# Patient Record
Sex: Female | Born: 1995
Health system: Southern US, Community
[De-identification: ages and names within clinical notes are randomized; demographics above are authoritative.]

## PROBLEM LIST (undated history)

## (undated) ENCOUNTER — Inpatient Hospital Stay (HOSPITAL_COMMUNITY): Payer: Self-pay

## (undated) DIAGNOSIS — F329 Major depressive disorder, single episode, unspecified: Secondary | ICD-10-CM

## (undated) DIAGNOSIS — Z5189 Encounter for other specified aftercare: Secondary | ICD-10-CM

## (undated) DIAGNOSIS — F1910 Other psychoactive substance abuse, uncomplicated: Secondary | ICD-10-CM

## (undated) DIAGNOSIS — IMO0002 Reserved for concepts with insufficient information to code with codable children: Secondary | ICD-10-CM

## (undated) DIAGNOSIS — F4310 Post-traumatic stress disorder, unspecified: Secondary | ICD-10-CM

## (undated) DIAGNOSIS — F603 Borderline personality disorder: Secondary | ICD-10-CM

## (undated) DIAGNOSIS — J189 Pneumonia, unspecified organism: Secondary | ICD-10-CM

## (undated) DIAGNOSIS — Z7289 Other problems related to lifestyle: Secondary | ICD-10-CM

## (undated) DIAGNOSIS — F909 Attention-deficit hyperactivity disorder, unspecified type: Secondary | ICD-10-CM

## (undated) DIAGNOSIS — O2340 Unspecified infection of urinary tract in pregnancy, unspecified trimester: Secondary | ICD-10-CM

## (undated) DIAGNOSIS — F32A Depression, unspecified: Secondary | ICD-10-CM

## (undated) DIAGNOSIS — D649 Anemia, unspecified: Secondary | ICD-10-CM

## (undated) DIAGNOSIS — F419 Anxiety disorder, unspecified: Secondary | ICD-10-CM

## (undated) DIAGNOSIS — Z3490 Encounter for supervision of normal pregnancy, unspecified, unspecified trimester: Secondary | ICD-10-CM

## (undated) DIAGNOSIS — F319 Bipolar disorder, unspecified: Secondary | ICD-10-CM

## (undated) DIAGNOSIS — T1491XA Suicide attempt, initial encounter: Secondary | ICD-10-CM

## (undated) HISTORY — DX: Unspecified infection of urinary tract in pregnancy, unspecified trimester: O23.40

## (undated) HISTORY — DX: Borderline personality disorder: F60.3

## (undated) HISTORY — DX: Bipolar disorder, unspecified: F31.9

## (undated) HISTORY — DX: Encounter for other specified aftercare: Z51.89

## (undated) HISTORY — DX: Encounter for supervision of normal pregnancy, unspecified, unspecified trimester: Z34.90

## (undated) HISTORY — PX: OTHER SURGICAL HISTORY: SHX169

## (undated) HISTORY — DX: Other problems related to lifestyle: Z72.89

## (undated) HISTORY — DX: Other psychoactive substance abuse, uncomplicated: F19.10

## (undated) HISTORY — DX: Anemia, unspecified: D64.9

## (undated) HISTORY — DX: Anxiety disorder, unspecified: F41.9

## (undated) HISTORY — DX: Reserved for concepts with insufficient information to code with codable children: IMO0002

## (undated) HISTORY — DX: Suicide attempt, initial encounter: T14.91XA

---
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
## 2017-01-12 ENCOUNTER — Ambulatory Visit (INDEPENDENT_AMBULATORY_CARE_PROVIDER_SITE_OTHER): Payer: Medicaid Other | Admitting: Obstetrics and Gynecology

## 2017-01-12 ENCOUNTER — Encounter: Payer: Self-pay | Admitting: Obstetrics and Gynecology

## 2017-01-12 ENCOUNTER — Ambulatory Visit (INDEPENDENT_AMBULATORY_CARE_PROVIDER_SITE_OTHER): Payer: Medicaid Other

## 2017-01-12 VITALS — BP 100/60 | HR 76 | Wt 151.8 lb

## 2017-01-12 DIAGNOSIS — O2302 Infections of kidney in pregnancy, second trimester: Secondary | ICD-10-CM | POA: Diagnosis not present

## 2017-01-12 DIAGNOSIS — Z1389 Encounter for screening for other disorder: Secondary | ICD-10-CM | POA: Diagnosis not present

## 2017-01-12 DIAGNOSIS — Z3403 Encounter for supervision of normal first pregnancy, third trimester: Secondary | ICD-10-CM

## 2017-01-12 DIAGNOSIS — Z331 Pregnant state, incidental: Secondary | ICD-10-CM | POA: Diagnosis not present

## 2017-01-12 DIAGNOSIS — F332 Major depressive disorder, recurrent severe without psychotic features: Secondary | ICD-10-CM | POA: Diagnosis not present

## 2017-01-12 LAB — POCT URINALYSIS DIPSTICK
Blood, UA: NEGATIVE
Glucose, UA: NEGATIVE
KETONES UA: NEGATIVE
LEUKOCYTES UA: NEGATIVE
NITRITE UA: NEGATIVE
PROTEIN UA: NEGATIVE

## 2017-01-12 NOTE — Progress Notes (Signed)
Patient ID: Tracy Stout, female   DOB: 31-Aug-1996, 21 y.o.   MRN: [id number] Fetal Surveillance Testing today:  [name]   High Risk Pregnancy Diagnosis(es):   Hx pneumonia/ARDS this pregnancy, Depression with self mutilation, Multiple illicit drug abuse  G1P0 [redacted]w[redacted]d Estimated Date of Delivery: 02/21/17  Blood pressure 100/60, pulse 76, weight 151 lb 12.8 oz (68.9 kg), last menstrual period 04/27/2016.  Urinalysis: Negative   HPI: The patient is being seen today for ongoing management of  Hx pneumonia/ARDS this pregnancy, Depression with self mutilation, Multiple illicit drug abuse. Today she reports tops of bilateral feet and toes with swelling noted. She states that she has been evaluated in the hospital recently due to near-syncope episode. Pt reports that she would like the IUD following delivery. Denies any other symptoms.    BP weight and urine results all reviewed and noted. Patient reports good fetal movement, denies any bleeding and no rupture of membranes symptoms or regular contractions.  Fundal Height:  35 cm Fetal Heart rate:  157 per [name] Edema:   Patient is without complaints other than noted in her HPI. All questions were answered.  All lab and sonogram results have been reviewed. Comments: normal  Assessment:  1.  Pregnancy at [redacted]w[redacted]d,  Estimated Date of Delivery: 02/21/17 :                         2.   Hx pneumonia/ARDS this pregnancy   3. Depression with self mutilation   4. Multiple illicit drug abuse recheck UDS  Medication(s) Plans:   Treatment Plan: follow up in 2 weeks for HROB  No Follow-up on file. for appointment for high risk OB care  No orders of the defined types were placed in this encounter.  Orders Placed This Encounter  Procedures  . POCT urinalysis dipstick   By signing my name below, I, Soijett Blue, attest that this documentation has been prepared under the direction and in the presence of Tilda Burrow, MD. Electronically Signed: Soijett Blue, ED  Scribe. 01/12/17. 10:01 AM.  I personally performed the services described in this documentation, which was SCRIBED in my presence. The recorded information has been reviewed and considered accurate. It has been edited as necessary during review. Tilda Burrow, MD

## 2017-01-12 NOTE — Progress Notes (Signed)
[name] 34+2 wks,cephalic,ant pl gr 1,normal ovaries bilat,fhr 157 bpm,afi 15.5 cm,efw 2627 g 62%

## 2017-01-22 ENCOUNTER — Inpatient Hospital Stay (HOSPITAL_COMMUNITY)
Admission: AD | Admit: 2017-01-22 | Discharge: 2017-01-22 | Disposition: A | Discharge status: Home / Self Care | Payer: Medicaid Other | Source: Ambulatory Visit | Attending: Obstetrics & Gynecology | Admitting: Obstetrics & Gynecology

## 2017-01-22 ENCOUNTER — Encounter (HOSPITAL_COMMUNITY): Payer: Self-pay

## 2017-01-22 DIAGNOSIS — M549 Dorsalgia, unspecified: Secondary | ICD-10-CM | POA: Diagnosis present

## 2017-01-22 DIAGNOSIS — R109 Unspecified abdominal pain: Secondary | ICD-10-CM | POA: Diagnosis present

## 2017-01-22 DIAGNOSIS — Z3A35 35 weeks gestation of pregnancy: Secondary | ICD-10-CM | POA: Insufficient documentation

## 2017-01-22 DIAGNOSIS — O26893 Other specified pregnancy related conditions, third trimester: Secondary | ICD-10-CM | POA: Diagnosis not present

## 2017-01-22 DIAGNOSIS — O4703 False labor before 37 completed weeks of gestation, third trimester: Secondary | ICD-10-CM | POA: Diagnosis not present

## 2017-01-22 DIAGNOSIS — O479 False labor, unspecified: Secondary | ICD-10-CM

## 2017-01-22 LAB — URINALYSIS, ROUTINE W REFLEX MICROSCOPIC
Bilirubin Urine: NEGATIVE
Glucose, UA: NEGATIVE mg/dL
Hgb urine dipstick: NEGATIVE
KETONES UR: NEGATIVE mg/dL
Nitrite: NEGATIVE
PROTEIN: 30 mg/dL — AB
Specific Gravity, Urine: 1.018 (ref 1.005–1.030)
pH: 6 (ref 5.0–8.0)

## 2017-01-22 LAB — WET PREP, GENITAL
Clue Cells Wet Prep HPF POC: NONE SEEN
SPERM: NONE SEEN
Trich, Wet Prep: NONE SEEN
YEAST WET PREP: NONE SEEN

## 2017-01-22 NOTE — MAU Note (Signed)
Pt presents to MAU with complaints of lower back and abdominal pain. Reports vaginal spotting at times when she wipes.

## 2017-01-22 NOTE — MAU Provider Note (Signed)
CC:  Chief Complaint  Patient presents with  . Back Pain  . Abdominal Pain    First Provider Initiated Contact with Patient 01/22/17 1459    HPI: Tracy Stout is a 21 y.o. year old G1P0 female at [redacted]w[redacted]d weeks gestation who presents to MAU reporting contractions and pelvic pressure. Unable to describe frequency  Associated Sx:  Vaginal bleeding: Denies Leaking of fluid: Denies Fetal movement: Nml  O: Patient Vitals for the past 24 hrs:  BP Temp Pulse Resp SpO2 Height Weight  01/22/17 1434 - - - - 100 % - -  01/22/17 1432 110/64 98 F (36.7 C) (!) 125 15 -  (1.702 m) 155 lb (70.3 kg)    General: NAD Heart: Regular rate Lungs: Normal rate and effort Abd: Soft, NT, Gravid, S=D Pelvic: NEFG, Neg LOF, Neg blood.  Dilation: Closed Effacement (%): 50 Cervical Position: Posterior Station: -2 Exam by:: [place] CNM  EFM: 125, mod variability, 15x15 accels, no decels Toco: UI  A: [redacted]w[redacted]d week IUP Braxton Hicks FHR reactive  P: Discharge home in stable condition. Preterm labor precautions and fetal kick counts. Follow-up as scheduled for prenatal visit or sooner as needed if symptoms worsen. Return to maternity admissions as needed if symptoms worsen.  [name], CNM 09/19/2016 11:40 PM  3

## 2017-01-22 NOTE — Progress Notes (Addendum)
G1@ 35.[redacted] wksga. Presents to triage for v. Pressure and ctx. Denies LOF or bleeding. + FM EFM applied. VSS see flow sheet for details  1500: Provider at bs assessing.  1510:  wetprep collected and sent to lab. SVE closed. Monitlors off per order. FHR reactive and reassuring  1555: MD made aware of labs.   Discharge instructions given with pt understanding. Pt left unit with family.

## 2017-01-22 NOTE — Discharge Instructions (Signed)

## 2017-01-25 ENCOUNTER — Ambulatory Visit (INDEPENDENT_AMBULATORY_CARE_PROVIDER_SITE_OTHER): Payer: Medicaid Other | Admitting: Obstetrics and Gynecology

## 2017-01-25 ENCOUNTER — Encounter: Payer: Self-pay | Admitting: Obstetrics and Gynecology

## 2017-01-25 VITALS — BP 112/60 | HR 88 | Wt 153.4 lb

## 2017-01-25 DIAGNOSIS — O2302 Infections of kidney in pregnancy, second trimester: Secondary | ICD-10-CM

## 2017-01-25 DIAGNOSIS — F1990 Other psychoactive substance use, unspecified, uncomplicated: Secondary | ICD-10-CM

## 2017-01-25 NOTE — Progress Notes (Signed)
High Risk Pregnancy HROB Diagnosis(es):   Hx pneumonia/ARDS this pregnancy, Depression with self mutilation, multiple illicit drug abuse  G1P0 [redacted]w[redacted]d Estimated Date of Delivery: 02/21/17    HPI: The patient is being seen today for ongoing management of above.  Today she reports no major complaints. States there was a period of reported 17 hours when the baby didn't move. Feels kicks currently.  Patient reports good fetal movement, denies any bleeding and no rupture of membranes symptoms or regular contractions.   BP weight and urine results reviewed and noted. Blood pressure 112/60, pulse 88, weight 153 lb 6.4 oz (69.6 kg), last menstrual period 04/27/2016.  Fundal Height:  37cm Fetal Heart rate:  143 Physical Examination: Abdomen - soft, nontender, nondistended, no masses or organomegaly                                     Pelvic - examination not indicated                                     Edema:  none  Urinalysis: did not give urine sample, attempted twice during visit, despite drinking water  Fetal Surveillance Testing today:  None at this time  Lab and sonogram results have been reviewed. Comments:   Assessment:  1.  Pregnancy at [redacted]w[redacted]d,  G1P0   :  Estimated Date of Delivery: 02/21/17                        2.  Hx pneumonia/ARDS this pregnancy,                        3. Depression with self mutilation             4. Multiple illicit drug abuse  Medication(s) Plans:  None at this time  Treatment Plan:  Follow up in 1 week   Follow up in 1 week for appointment for high risk OB care,needs uds this month.     By signing my name below, I, Sonum Patel, attest that this documentation has been prepared under the direction and in the presence of Tilda Burrow, MD. Electronically Signed: Sonum Patel, Neurosurgeon. 01/25/17. 3:13 PM.  I personally performed the services described in this documentation, which was SCRIBED in my presence. The recorded information has been reviewed and  considered accurate. It has been edited as necessary during review. Tilda Burrow, MD

## 2017-01-27 ENCOUNTER — Encounter: Payer: Medicaid Other | Admitting: Obstetrics and Gynecology

## 2017-01-27 ENCOUNTER — Encounter: Payer: Medicaid Other | Admitting: Women's Health

## 2017-02-01 ENCOUNTER — Ambulatory Visit (INDEPENDENT_AMBULATORY_CARE_PROVIDER_SITE_OTHER): Payer: Medicaid Other | Admitting: Advanced Practice Midwife

## 2017-02-01 ENCOUNTER — Encounter: Payer: Self-pay | Admitting: Advanced Practice Midwife

## 2017-02-01 VITALS — BP 90/60 | HR 102 | Wt 155.0 lb

## 2017-02-01 DIAGNOSIS — Z331 Pregnant state, incidental: Secondary | ICD-10-CM

## 2017-02-01 DIAGNOSIS — Z1389 Encounter for screening for other disorder: Secondary | ICD-10-CM | POA: Diagnosis not present

## 2017-02-01 DIAGNOSIS — O99323 Drug use complicating pregnancy, third trimester: Secondary | ICD-10-CM

## 2017-02-01 DIAGNOSIS — O0993 Supervision of high risk pregnancy, unspecified, third trimester: Secondary | ICD-10-CM | POA: Diagnosis not present

## 2017-02-01 DIAGNOSIS — Z3A37 37 weeks gestation of pregnancy: Secondary | ICD-10-CM

## 2017-02-01 DIAGNOSIS — F332 Major depressive disorder, recurrent severe without psychotic features: Secondary | ICD-10-CM | POA: Diagnosis not present

## 2017-02-01 DIAGNOSIS — Z3403 Encounter for supervision of normal first pregnancy, third trimester: Secondary | ICD-10-CM

## 2017-02-01 DIAGNOSIS — O26843 Uterine size-date discrepancy, third trimester: Secondary | ICD-10-CM | POA: Diagnosis not present

## 2017-02-01 NOTE — Patient Instructions (Signed)

## 2017-02-01 NOTE — Progress Notes (Signed)
G1P0 7571w1d Estimated Date of Delivery: 02/21/17  Blood pressure 90/60, pulse (!) 102, weight 155 lb (70.3 kg), last menstrual period 04/27/2016.   BP weight results all reviewed and noted. Pt still "could not" void.    Please refer to the obstetrical flow sheet for the fundal height and fetal heart rate documentation: size < dates  Patient reports good fetal movement, denies any bleeding and no rupture of membranes symptoms or regular contractions. Patient is without complaints. All questions were answered.  Orders Placed This Encounter  Procedures  . GC/Chlamydia Probe Amp  . Strep Gp B NAA  . US OB Follow Up  . POCT Urinalysis Dipstick    Plan:  Continued routine obstetrical care,   Return in about 1 week (around 02/08/2017) for LROB/EFW                                                                                            , US:EFW, HROB.

## 2017-02-02 ENCOUNTER — Telehealth: Payer: Self-pay | Admitting: *Deleted

## 2017-02-02 LAB — GC/CHLAMYDIA PROBE AMP
Chlamydia trachomatis, NAA: NEGATIVE
NEISSERIA GONORRHOEAE BY PCR: NEGATIVE

## 2017-02-02 NOTE — Telephone Encounter (Signed)
Patient called requesting results from GBS, CHL/GC. Informed results not in and may take a week. Verbalized understanding.

## 2017-02-03 LAB — STREP GP B NAA: Strep Gp B NAA: NEGATIVE

## 2017-02-06 ENCOUNTER — Telehealth: Payer: Self-pay | Admitting: Obstetrics and Gynecology

## 2017-02-06 ENCOUNTER — Telehealth: Payer: Self-pay | Admitting: *Deleted

## 2017-02-06 NOTE — Telephone Encounter (Signed)
Patient called for GBS, GC/CHL results. Informed all were negative. Verbalized gratitude.
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
## 2017-02-09 ENCOUNTER — Ambulatory Visit (INDEPENDENT_AMBULATORY_CARE_PROVIDER_SITE_OTHER): Payer: Medicaid Other | Admitting: Obstetrics and Gynecology

## 2017-02-09 ENCOUNTER — Ambulatory Visit (INDEPENDENT_AMBULATORY_CARE_PROVIDER_SITE_OTHER): Payer: Medicaid Other

## 2017-02-09 ENCOUNTER — Encounter: Payer: Medicaid Other | Admitting: Obstetrics and Gynecology

## 2017-02-09 VITALS — BP 108/62 | HR 83 | Wt 153.0 lb

## 2017-02-09 DIAGNOSIS — O0993 Supervision of high risk pregnancy, unspecified, third trimester: Secondary | ICD-10-CM | POA: Diagnosis not present

## 2017-02-09 DIAGNOSIS — Z1389 Encounter for screening for other disorder: Secondary | ICD-10-CM

## 2017-02-09 DIAGNOSIS — Z3403 Encounter for supervision of normal first pregnancy, third trimester: Secondary | ICD-10-CM

## 2017-02-09 DIAGNOSIS — F329 Major depressive disorder, single episode, unspecified: Secondary | ICD-10-CM | POA: Diagnosis not present

## 2017-02-09 DIAGNOSIS — O26843 Uterine size-date discrepancy, third trimester: Secondary | ICD-10-CM | POA: Diagnosis not present

## 2017-02-09 DIAGNOSIS — Z3A38 38 weeks gestation of pregnancy: Secondary | ICD-10-CM

## 2017-02-09 DIAGNOSIS — O99343 Other mental disorders complicating pregnancy, third trimester: Secondary | ICD-10-CM | POA: Diagnosis not present

## 2017-02-09 DIAGNOSIS — Z331 Pregnant state, incidental: Secondary | ICD-10-CM

## 2017-02-09 LAB — POCT URINALYSIS DIPSTICK
Blood, UA: NEGATIVE
Glucose, UA: NEGATIVE
Nitrite, UA: NEGATIVE
PROTEIN UA: NEGATIVE
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
## 2017-02-09 NOTE — Progress Notes (Signed)
US 38+2 wks,cephalic,ant pl gr 3,normal ov's bilat,fhr 144 bpm,afi 11.4 cm,efw 3555 g 72%

## 2017-02-10 LAB — PMP SCREEN PROFILE (10S), URINE
Amphetamine Scrn, Ur: NEGATIVE ng/mL
BARBITURATE SCREEN URINE: NEGATIVE ng/mL
BENZODIAZEPINE SCREEN, URINE: NEGATIVE ng/mL
CANNABINOIDS UR QL SCN: POSITIVE ng/mL
CREATININE(CRT), U: 228.9 mg/dL (ref 20.0–300.0)
Cocaine (Metab) Scrn, Ur: NEGATIVE ng/mL
Methadone Screen, Urine: NEGATIVE ng/mL
OPIATE SCREEN URINE: NEGATIVE ng/mL
OXYCODONE+OXYMORPHONE UR QL SCN: POSITIVE ng/mL
Ph of Urine: 7.3 (ref 4.5–8.9)
Phencyclidine Qn, Ur: NEGATIVE ng/mL
Propoxyphene Scrn, Ur: NEGATIVE ng/mL

## 2017-02-15 ENCOUNTER — Encounter: Payer: Self-pay | Admitting: Obstetrics and Gynecology

## 2017-02-15 ENCOUNTER — Ambulatory Visit (INDEPENDENT_AMBULATORY_CARE_PROVIDER_SITE_OTHER): Payer: Medicaid Other | Admitting: Obstetrics and Gynecology

## 2017-02-15 VITALS — BP 102/66 | HR 87 | Wt 152.0 lb

## 2017-02-15 DIAGNOSIS — Z3403 Encounter for supervision of normal first pregnancy, third trimester: Secondary | ICD-10-CM | POA: Diagnosis not present

## 2017-02-15 DIAGNOSIS — Z331 Pregnant state, incidental: Secondary | ICD-10-CM

## 2017-02-15 DIAGNOSIS — Z1389 Encounter for screening for other disorder: Secondary | ICD-10-CM

## 2017-02-15 LAB — POCT URINALYSIS DIPSTICK
Glucose, UA: NEGATIVE
Ketones, UA: NEGATIVE
Leukocytes, UA: NEGATIVE
NITRITE UA: NEGATIVE
PROTEIN UA: NEGATIVE
RBC UA: NEGATIVE
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
## 2017-02-21 ENCOUNTER — Telehealth: Payer: Self-pay | Admitting: *Deleted

## 2017-02-21 NOTE — Telephone Encounter (Signed)
Patient called stating she is ready to have her baby, wants to be scheduled for an induction. Her due date is today and she is having lots of back pain and can't get comfortable. Informed patient that some patients will go 42 weeks before they are induced. Advised that at her next appointment, she could discuss that with the provider. Educated patient on various things to try to get contractions started; intercourse, walking, nipple stimulation. Encouraged warm baths, hands and knees and exaggerated positions to help with back pain. Verbalized understanding. Will try.

## 2017-02-23 ENCOUNTER — Ambulatory Visit (INDEPENDENT_AMBULATORY_CARE_PROVIDER_SITE_OTHER): Payer: Medicaid Other | Admitting: Advanced Practice Midwife

## 2017-02-23 ENCOUNTER — Encounter: Payer: Self-pay | Admitting: Advanced Practice Midwife

## 2017-02-23 VITALS — BP 90/54 | HR 72 | Wt 154.0 lb

## 2017-02-23 DIAGNOSIS — O480 Post-term pregnancy: Secondary | ICD-10-CM

## 2017-02-23 DIAGNOSIS — Z331 Pregnant state, incidental: Secondary | ICD-10-CM | POA: Diagnosis not present

## 2017-02-23 DIAGNOSIS — Z3403 Encounter for supervision of normal first pregnancy, third trimester: Secondary | ICD-10-CM

## 2017-02-23 DIAGNOSIS — Z30017 Encounter for initial prescription of implantable subdermal contraceptive: Secondary | ICD-10-CM

## 2017-02-23 DIAGNOSIS — Z1389 Encounter for screening for other disorder: Secondary | ICD-10-CM | POA: Diagnosis not present

## 2017-02-23 LAB — POCT URINALYSIS DIPSTICK
Blood, UA: NEGATIVE
GLUCOSE UA: NEGATIVE
Ketones, UA: NEGATIVE
NITRITE UA: NEGATIVE

## 2017-02-23 NOTE — Progress Notes (Signed)
   Induction Assessment Scheduling Form: [facility]ax to [facility]:  [phone]  Rodney Cruisemily Mccall                                                                                   DOB:  10/03/1995                                                            MRN:  [id number]                                                                     Phone #:   [phone]                         [facility]  GP:  G1P0                                                            Estimated Date of Delivery: 02/21/17  Dating Criteria: early US/LMP    Medical Indications for induction:  Post dates Admission Date/Time:  6/9 at [facility] Gestational age on admission:  41.3   There were no vitals filed for this visit. HIV:  Non Reactive (03/07 95630927) GBS: Negative (05/09 1700)  1/thick/-2   Method of induction(proposed):  cytotec   Scheduling Provider Signature:  Greig RightRESENZO-DISHMAN,Donta Fuster, CNM                                            Today's Date:  02/23/2017

## 2017-02-23 NOTE — Progress Notes (Signed)
G1P0 6476w2d Estimated Date of Delivery: 02/21/17  Blood pressure (!) 90/54, pulse 72, weight 154 lb (69.9 kg), last menstrual period 04/27/2016.   BP weight and urine results all reviewed and noted.  Please refer to the obstetrical flow sheet for the fundal height and fetal heart rate documentation:  Patient reports good fetal movement, denies any bleeding and no rupture of membranes symptoms or regular contractions. Patient is without complaints. All questions were answered.  Orders Placed This Encounter  Procedures  . POCT urinalysis dipstick    Plan:  Continued routine obstetrical care, IOL scheduled for 6/8 at midnight if undelivered (orders in)  Return in 5 days (on 02/28/2017) for LROB, US:BPP.

## 2017-02-23 NOTE — Patient Instructions (Signed)

## 2017-02-24 ENCOUNTER — Telehealth (HOSPITAL_COMMUNITY): Payer: Self-pay | Admitting: *Deleted

## 2017-02-24 NOTE — Telephone Encounter (Signed)
Preadmission screen  

## 2017-03-01 ENCOUNTER — Ambulatory Visit (INDEPENDENT_AMBULATORY_CARE_PROVIDER_SITE_OTHER): Payer: Medicaid Other | Admitting: Obstetrics and Gynecology

## 2017-03-01 ENCOUNTER — Ambulatory Visit (INDEPENDENT_AMBULATORY_CARE_PROVIDER_SITE_OTHER): Payer: Medicaid Other

## 2017-03-01 ENCOUNTER — Encounter: Payer: Self-pay | Admitting: Obstetrics and Gynecology

## 2017-03-01 VITALS — BP 110/60 | HR 92 | Wt 156.4 lb

## 2017-03-01 DIAGNOSIS — Z3493 Encounter for supervision of normal pregnancy, unspecified, third trimester: Secondary | ICD-10-CM | POA: Diagnosis not present

## 2017-03-01 DIAGNOSIS — O480 Post-term pregnancy: Secondary | ICD-10-CM

## 2017-03-01 LAB — POCT URINALYSIS DIPSTICK
Blood, UA: NEGATIVE
Glucose, UA: NEGATIVE
KETONES UA: NEGATIVE
LEUKOCYTES UA: NEGATIVE
Nitrite, UA: NEGATIVE
Protein, UA: NEGATIVE

## 2017-03-01 NOTE — Progress Notes (Signed)
BPP US today @ 9010w1d GA. Single active fetus with FHR 133bpm. Cephalic presentation. EDD 02/21/17. BPP 8/8. AFI 12cm and is subjectively WNL.

## 2017-03-01 NOTE — Progress Notes (Addendum)
Patient ID: Tracy Stout, female   DOB: 05/24/1996, 21 y.o.   MRN: [id number]  High Risk Pregnancy HROB Diagnosis(es):   Hx pneumonia/ARDS this pregnancy, Depression with self mutilation, multiple illicit drug abuse  G1P0 5652w1d Estimated Date of Delivery: 02/21/17     HPI: The patient is being seen today for ongoing management of the above.  Chief Complaint  Patient presents with  . Routine Prenatal Visit    U/S today  ____  Today she reports she is doing well , cheerful, no SI or HI  Patient reports good fetal movement. She denies any bleeding and no rupture of membranes symptoms or regular contractions.  BP weight and urine results reviewed and noted. Blood pressure 110/60, pulse 92, weight 156 lb 6.4 oz (70.9 kg), last menstrual period 04/27/2016.  Fetal Heart rate:  143 bpm Physical Examination: Abdomen - soft, nontender, nondistended, no masses or organomegaly                                     Pelvic - VULVA: normal appearing vulva with no masses, tenderness or lesions, VAGINA: normal appearing vagina with normal color and discharge, no lesions, CERVIX: normal appearing cervix without discharge or lesions, 1 cm, soft, -2, posterior, fetus vertex                                      Edema:  none  Urinalysis:NEGATIVE for all         Fetal Surveillance Testing today:  BPP reactive at 8/8  Lab and sonogram results have been reviewed.  Assessment:  1.  Pregnancy at 7452w1d,  G1P0   :  Estimated Date of Delivery: 02/21/17                         2.  Hx pneumonia/ARDS this pregnancy                  3.  Depression with self mutilation                         4. multiple illicit drug abuse prior to preg Medication(s) Plans:  none  Treatment Plan:   IOL 03/03/17 at midnight   Follow up as  scheduled for IOL, 4w for post partum check    By signing my name below, I, Tracy Stout, attest that this documentation has been prepared under the direction and in the presence of Tracy Stout, Ayde Record V, MD. Electronically Signed: Doreatha MartinEva Stout, ED Scribe. 03/01/17. 4:30 PM.  I personally performed the services described in this documentation, which was SCRIBED in my presence. The recorded information has been reviewed and considered accurate. It has been edited as necessary during review. Tracy Stout,Tracy Seitzinger V, MD

## 2017-03-03 ENCOUNTER — Other Ambulatory Visit: Payer: Self-pay | Admitting: Obstetrics & Gynecology

## 2017-03-04 ENCOUNTER — Inpatient Hospital Stay (HOSPITAL_COMMUNITY)
Admission: RE | Admit: 2017-03-04 | Discharge: 2017-03-06 | DRG: 775 | Disposition: A | Discharge status: Home / Self Care | Payer: Medicaid Other | Source: Ambulatory Visit | Attending: Obstetrics and Gynecology | Admitting: Obstetrics and Gynecology

## 2017-03-04 ENCOUNTER — Inpatient Hospital Stay (HOSPITAL_COMMUNITY): Payer: Medicaid Other | Admitting: Anesthesiology

## 2017-03-04 ENCOUNTER — Encounter (HOSPITAL_COMMUNITY): Payer: Self-pay

## 2017-03-04 DIAGNOSIS — F1290 Cannabis use, unspecified, uncomplicated: Secondary | ICD-10-CM | POA: Diagnosis present

## 2017-03-04 DIAGNOSIS — O99344 Other mental disorders complicating childbirth: Secondary | ICD-10-CM | POA: Diagnosis present

## 2017-03-04 DIAGNOSIS — O480 Post-term pregnancy: Secondary | ICD-10-CM | POA: Diagnosis present

## 2017-03-04 DIAGNOSIS — Z87891 Personal history of nicotine dependence: Secondary | ICD-10-CM | POA: Diagnosis not present

## 2017-03-04 DIAGNOSIS — O99324 Drug use complicating childbirth: Secondary | ICD-10-CM | POA: Diagnosis present

## 2017-03-04 DIAGNOSIS — Z3A41 41 weeks gestation of pregnancy: Secondary | ICD-10-CM | POA: Diagnosis not present

## 2017-03-04 DIAGNOSIS — F319 Bipolar disorder, unspecified: Secondary | ICD-10-CM | POA: Diagnosis present

## 2017-03-04 DIAGNOSIS — Z3403 Encounter for supervision of normal first pregnancy, third trimester: Secondary | ICD-10-CM

## 2017-03-04 DIAGNOSIS — F902 Attention-deficit hyperactivity disorder, combined type: Secondary | ICD-10-CM | POA: Diagnosis present

## 2017-03-04 HISTORY — DX: Pneumonia, unspecified organism: J18.9

## 2017-03-04 LAB — RAPID URINE DRUG SCREEN, HOSP PERFORMED
AMPHETAMINES: NOT DETECTED
BENZODIAZEPINES: NOT DETECTED
Barbiturates: NOT DETECTED
Cocaine: NOT DETECTED
OPIATES: NOT DETECTED
Tetrahydrocannabinol: NOT DETECTED

## 2017-03-04 LAB — RPR: RPR: NONREACTIVE

## 2017-03-04 LAB — TYPE AND SCREEN
ABO/RH(D): O POS
ANTIBODY SCREEN: NEGATIVE

## 2017-03-04 LAB — CBC
HCT: 32.8 % — ABNORMAL LOW (ref 36.0–46.0)
Hemoglobin: 11 g/dL — ABNORMAL LOW (ref 12.0–15.0)
MCH: 31.3 pg (ref 26.0–34.0)
MCHC: 33.5 g/dL (ref 30.0–36.0)
MCV: 93.4 fL (ref 78.0–100.0)
PLATELETS: 154 10*3/uL (ref 150–400)
RBC: 3.51 MIL/uL — AB (ref 3.87–5.11)
RDW: 14.2 % (ref 11.5–15.5)
WBC: 7.2 10*3/uL (ref 4.0–10.5)

## 2017-03-04 MED ORDER — CYCLOBENZAPRINE HCL 5 MG PO TABS
5.0000 mg | ORAL_TABLET | Freq: Three times a day (TID) | ORAL | Status: DC | PRN
  Administered 2017-03-04: 5 mg via ORAL
  Filled 2017-03-04 (×2): qty 1

## 2017-03-04 MED ORDER — ACETAMINOPHEN 325 MG PO TABS: 650.0000 mg | ORAL_TABLET | ORAL | Status: DC | PRN

## 2017-03-04 MED ORDER — LACTATED RINGERS IV SOLN
INTRAVENOUS | Status: DC
  Administered 2017-03-04 – 2017-03-05 (×2): via INTRAVENOUS

## 2017-03-04 MED ORDER — LIDOCAINE HCL (PF) 1 % IJ SOLN
30.0000 mL | INTRAMUSCULAR | Status: DC | PRN
  Administered 2017-03-05: 30 mL via SUBCUTANEOUS
  Filled 2017-03-04: qty 30

## 2017-03-04 MED ORDER — TERBUTALINE SULFATE 1 MG/ML IJ SOLN
0.2500 mg | Freq: Once | INTRAMUSCULAR | Status: DC | PRN
  Filled 2017-03-04: qty 1

## 2017-03-04 MED ORDER — FENTANYL 2.5 MCG/ML BUPIVACAINE 1/10 % EPIDURAL INFUSION (WH - ANES)
14.0000 mL/h | INTRAMUSCULAR | Status: DC | PRN
  Administered 2017-03-04: 14 mL/h via EPIDURAL

## 2017-03-04 MED ORDER — FENTANYL CITRATE (PF) 100 MCG/2ML IJ SOLN
100.0000 ug | Freq: Once | INTRAMUSCULAR | Status: AC
Start: 2017-03-04 — End: 2017-03-04
  Administered 2017-03-04: 100 ug via INTRAVENOUS
  Filled 2017-03-04: qty 2

## 2017-03-04 MED ORDER — PHENYLEPHRINE 40 MCG/ML (10ML) SYRINGE FOR IV PUSH (FOR BLOOD PRESSURE SUPPORT)
80.0000 ug | PREFILLED_SYRINGE | INTRAVENOUS | Status: DC | PRN
  Filled 2017-03-04: qty 5

## 2017-03-04 MED ORDER — FLEET ENEMA 7-19 GM/118ML RE ENEM: 1.0000 | ENEMA | RECTAL | Status: DC | PRN

## 2017-03-04 MED ORDER — FENTANYL CITRATE (PF) 100 MCG/2ML IJ SOLN
INTRAMUSCULAR | Status: AC
  Filled 2017-03-04: qty 2

## 2017-03-04 MED ORDER — ONDANSETRON HCL 4 MG/2ML IJ SOLN: 4.0000 mg | Freq: Four times a day (QID) | INTRAMUSCULAR | Status: DC | PRN

## 2017-03-04 MED ORDER — EPHEDRINE 5 MG/ML INJ
10.0000 mg | INTRAVENOUS | Status: DC | PRN
  Filled 2017-03-04: qty 2

## 2017-03-04 MED ORDER — LIDOCAINE HCL (PF) 1 % IJ SOLN
INTRAMUSCULAR | Status: DC | PRN
  Administered 2017-03-04 (×2): 4 mL

## 2017-03-04 MED ORDER — LACTATED RINGERS IV SOLN
500.0000 mL | Freq: Once | INTRAVENOUS | Status: AC
  Administered 2017-03-04: 500 mL via INTRAVENOUS

## 2017-03-04 MED ORDER — FENTANYL CITRATE (PF) 100 MCG/2ML IJ SOLN
100.0000 ug | Freq: Once | INTRAMUSCULAR | Status: AC
  Administered 2017-03-04: 100 ug via INTRAVENOUS

## 2017-03-04 MED ORDER — OXYTOCIN 40 UNITS IN LACTATED RINGERS INFUSION - SIMPLE MED
1.0000 m[IU]/min | INTRAVENOUS | Status: DC
  Administered 2017-03-04: 2 m[IU]/min via INTRAVENOUS

## 2017-03-04 MED ORDER — OXYTOCIN BOLUS FROM INFUSION
500.0000 mL | Freq: Once | INTRAVENOUS | Status: AC
  Administered 2017-03-05: 500 mL via INTRAVENOUS

## 2017-03-04 MED ORDER — OXYCODONE-ACETAMINOPHEN 5-325 MG PO TABS: 1.0000 | ORAL_TABLET | ORAL | Status: DC | PRN

## 2017-03-04 MED ORDER — FENTANYL 2.5 MCG/ML BUPIVACAINE 1/10 % EPIDURAL INFUSION (WH - ANES)
INTRAMUSCULAR | Status: AC
  Filled 2017-03-04: qty 100

## 2017-03-04 MED ORDER — FENTANYL 2.5 MCG/ML BUPIVACAINE 1/10 % EPIDURAL INFUSION (WH - ANES): 14.0000 mL/h | INTRAMUSCULAR | Status: DC | PRN

## 2017-03-04 MED ORDER — DIPHENHYDRAMINE HCL 50 MG/ML IJ SOLN: 12.5000 mg | INTRAMUSCULAR | Status: DC | PRN

## 2017-03-04 MED ORDER — OXYTOCIN 40 UNITS IN LACTATED RINGERS INFUSION - SIMPLE MED
2.5000 [IU]/h | INTRAVENOUS | Status: DC
  Filled 2017-03-04: qty 1000

## 2017-03-04 MED ORDER — SOD CITRATE-CITRIC ACID 500-334 MG/5ML PO SOLN: 30.0000 mL | ORAL | Status: DC | PRN

## 2017-03-04 MED ORDER — OXYCODONE-ACETAMINOPHEN 5-325 MG PO TABS: 2.0000 | ORAL_TABLET | ORAL | Status: DC | PRN

## 2017-03-04 MED ORDER — MISOPROSTOL 25 MCG QUARTER TABLET
25.0000 ug | ORAL_TABLET | ORAL | Status: DC | PRN
  Administered 2017-03-04: 25 ug via VAGINAL
  Filled 2017-03-04 (×2): qty 1

## 2017-03-04 MED ORDER — ZOLPIDEM TARTRATE 5 MG PO TABS
5.0000 mg | ORAL_TABLET | Freq: Every evening | ORAL | Status: DC | PRN
  Administered 2017-03-04: 5 mg via ORAL
  Filled 2017-03-04: qty 1

## 2017-03-04 MED ORDER — LIDOCAINE HCL (PF) 1 % IJ SOLN
30.0000 mL | INTRAMUSCULAR | Status: DC | PRN
  Filled 2017-03-04: qty 30

## 2017-03-04 MED ORDER — OXYTOCIN 10 UNIT/ML IJ SOLN: 10.0000 [IU] | Freq: Once | INTRAMUSCULAR | Status: DC

## 2017-03-04 MED ORDER — LACTATED RINGERS IV SOLN
500.0000 mL | INTRAVENOUS | Status: DC | PRN
  Administered 2017-03-04: 500 mL via INTRAVENOUS

## 2017-03-04 MED ORDER — PHENYLEPHRINE 40 MCG/ML (10ML) SYRINGE FOR IV PUSH (FOR BLOOD PRESSURE SUPPORT)
PREFILLED_SYRINGE | INTRAVENOUS | Status: AC
  Filled 2017-03-04: qty 20

## 2017-03-04 MED ORDER — LACTATED RINGERS IV SOLN
INTRAVENOUS | Status: DC
  Administered 2017-03-04 (×2): via INTRAVENOUS

## 2017-03-04 NOTE — Anesthesia Procedure Notes (Signed)
Epidural Patient location during procedure: OB  Staffing Anesthesiologist: Lewie LoronGERMEROTH, Trennon Torbeck Performed: anesthesiologist   Preanesthetic Checklist Completed: patient identified, pre-op evaluation, timeout performed, IV checked, risks and benefits discussed and monitors and equipment checked  Epidural Patient position: sitting Prep: site prepped and draped and DuraPrep Patient monitoring: heart rate Approach: midline Location: L2-L3 Injection technique: LOR air and LOR saline  Needle:  Needle type: Tuohy  Needle gauge: 17 G Needle length: 9 cm Needle insertion depth: 5 cm Catheter type: closed end flexible Catheter size: 19 Gauge Catheter at skin depth: 10 cm Test dose: negative  Assessment Sensory level: T8 Events: blood not aspirated, injection not painful, no injection resistance, negative IV test and no paresthesia  Additional Notes Reason for block:procedure for pain

## 2017-03-04 NOTE — Progress Notes (Signed)
Patient seen with no complaints. NOt feeling contractions, but monitor is showing contractions every 1-2 minutes. Category 1 tracings. Cervix 1.5/80/-3. Foley bulb placed.  Ernestina PennaNicholas Schenk, MD 03/04/17 6:44 AM

## 2017-03-04 NOTE — H&P (Signed)
LABOR AND DELIVERY ADMISSION HISTORY AND PHYSICAL NOTE  Tracy Stout is a 21 y.o. female G1P0 with IUP at 8316w4d by 6 week ultrasound presenting for IOL for postdates. She reports +FM, + contractions, No LOF, no VB, no blurry vision, headaches or peripheral edema, and RUQ pain.  She plans on breast feeding. She request mirena for birth control. Of note patient has a history of sepsis secondary to pyelonephritis and complicated by ARDS pneumonia requiring intubation in the ICU during this pregnancy, now resolved.  Dating: By 6w U/S --->  Estimated Date of Delivery: 02/21/17  Prenatal History/Complications:  Past Medical History: Past Medical History:  Diagnosis Date  . ADHD (attention deficit hyperactivity disorder)   . Anxiety   . Bipolar 1 disorder (HCC)   . Depression   . Supervision of normal pregnancy 06/30/2016    [facility] Initiated Care at   6+2 weeks FOB  [place] 21 yo bm 4th Dating By  US  Pap   NA GC/CT Initial:                36+wks: Genetic Screen NT/IT:  CF screen  Anatomic US  Flu vaccine  Tdap Recommended ~ 28wks Glucose Screen  2 hr GBS  Feed Preference  Contraception  Circumcision  Childbirth Classes  Pediatrician    . UTI (urinary tract infection) during pregnancy 09/2016    Past Surgical History: Past Surgical History:  Procedure Laterality Date  . tubes in ears      Obstetrical History: OB History    Gravida Para Term Preterm AB Living   1             SAB TAB Ectopic Multiple Live Births                  Social History: Social History   Social History  . Marital status: Single    Spouse name: N/A  . Number of children: N/A  . Years of education: N/A   Social History Main Topics  . Smoking status: Former Smoker    Years: 3.00    Types: Cigarettes  . Smokeless tobacco: Never Used     Comment: smokes 1 cig daily  . Alcohol use No  . Drug use: No     Comment: marijuana last used 11/3016, Hadn't used other drugs  06/11/2016  . Sexual  activity: Not Currently    Birth control/ protection: None   Other Topics Concern  . Not on file   Social History Narrative  . No narrative on file    Family History: Family History  Problem Relation Age of Onset  . Hypertension Paternal Grandmother   . Bipolar disorder Paternal Grandmother   . Depression Maternal Grandmother   . Stroke Maternal Grandfather        X 5  . Bipolar disorder Father   . ADD / ADHD Father   . Hypertension Father   . Drug abuse Father        drug overdose  . Depression Father   . Depression Mother   . ADD / ADHD Brother   . Bipolar disorder Sister   . Depression Sister   . ADD / ADHD Sister   . Other Sister        heart condition; had open heart surgery    Allergies: No Known Allergies  Prescriptions Prior to Admission  Medication Sig Dispense Refill Last Dose  . cephALEXin (KEFLEX) 500 MG capsule Take 1 capsule (500 mg total)  by mouth at bedtime. 30 capsule 2 Taking  . escitalopram (LEXAPRO) 20 MG tablet Take 1 tablet (20 mg total) by mouth daily. 30 tablet 11 Taking  . flintstones complete (FLINTSTONES) 60 MG chewable tablet Chew 2 tablets by mouth daily.    Taking     Review of Systems   All systems reviewed and negative except as stated in HPI  Physical Exam Last menstrual period 04/27/2016. General appearance: alert, cooperative and appears stated age Fetal monitoring: 120 bmp, mod var, accels no decels Uterine activity: irregular contractions    Prenatal labs: ABO, Rh: --/--/O POS (01/18 1605) Antibody: Negative (03/07 0927) Rubella: !Error! RPR: Non Reactive (03/07 0927)  HBsAg: Negative (10/05 1136)  HIV: Non Reactive (03/07 0927)  GBS: Negative (05/09 1700)  2 hr Glucola: normal Genetic screening:  negative Anatomy US: normal female  Prenatal Transfer Tool  Maternal Diabetes: No Genetic Screening: Normal Maternal Ultrasounds/Referrals: Normal Fetal Ultrasounds or other Referrals:  None Maternal Substance  Abuse:  Yes; history of cocaine, benzo, THC, opiates. THC during this pregnancy Significant Maternal Medications:  Meds include: Other:  Lexapro Significant Maternal Lab Results: Lab values include: Group B Strep negative  No results found for this or any previous visit (from the past 24 hour(s)).  Patient Active Problem List   Diagnosis Date Noted  . Drug use 11/07/2016  . Anemia   . Pyelonephritis affecting pregnancy 10/06/2016  . 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 Rubella non-immune status, antepartum 09/08/2016  . Supervision of normal first pregnancy 06/30/2016  . Generalized anxiety disorder 10/25/2014  . 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 ADHD (attention deficit hyperactivity disorder), combined type 07/23/2013    Assessment: Tracy Stout is a 21 y.o. G1P0 at [redacted]w[redacted]d here for IOL for postdates.   #Labor: IOL start with cytotec #Pain: Planning epidural #FWB: Category I #ID:  GBS neg #MOF: breast #MOC:mirena #Circ:  NA  Howard Pouch, MD PGY-1 [facility]  03/04/2017, 12:29 AM   OB FELLOW HISTORY AND PHYSICAL ATTESTATION  I confirm that I have verified the information documented in the resident's note and that I have also personally reperformed the physical exam and all medical decision making activities.      Ernestina Penna 03/04/2017, 1:02 AM

## 2017-03-04 NOTE — Progress Notes (Signed)
Pt continues to c/o back pain.  Refusing Flexeril stating "it does nothing for me."  The heat packs have "helped some".  MD updated on pain issues.  Order for fentnyl received.  Reviewed options for pain management with pt and mother.  Pt wants to order something to eat then she wants the fentnyl.

## 2017-03-04 NOTE — Anesthesia Preprocedure Evaluation (Signed)
Anesthesia Evaluation  Patient identified by MRN, date of birth, ID band Patient awake    Reviewed: Allergy & Precautions, NPO status , [name] Chart, lab work & pertinent test results  Airway Mallampati: II  TM Distance: >3 FB Neck ROM: Full    Dental no notable dental hx.    Pulmonary pneumonia, former smoker,    Pulmonary exam normal breath sounds clear to auscultation       Cardiovascular negative cardio ROS Normal cardiovascular exam Rhythm:Regular Rate:Normal     Neuro/Psych PSYCHIATRIC DISORDERS Anxiety Depression Bipolar Disorder negative neurological ROS     GI/Hepatic negative GI ROS, Neg liver ROS,   Endo/Other  negative endocrine ROS  Renal/GU negative Renal ROS     Musculoskeletal negative musculoskeletal ROS (+)   Abdominal   Peds  Hematology  (+) anemia ,   Anesthesia Other Findings   Reproductive/Obstetrics (+) Pregnancy                             Anesthesia Physical Anesthesia Plan  ASA: II  Anesthesia Plan: Epidural   Post-op Pain Management:    Induction:   PONV Risk Score and Plan:   Airway Management Planned:   Additional Equipment:   Intra-op Plan:   Post-operative Plan:   Informed Consent: I have reviewed the patients History and Physical, chart, labs and discussed the procedure including the risks, benefits and alternatives for the proposed anesthesia with the patient or authorized representative who has indicated his/her understanding and acceptance.     Plan Discussed with:   Anesthesia Plan Comments:         Anesthesia Quick Evaluation

## 2017-03-04 NOTE — Anesthesia Pain Management Evaluation Note (Signed)
  CRNA Pain Management Visit Note  Patient: Tracy Stout, 21 y.o., female  "Hello I am a [facility]. We have an anesthesia team available at all times to provide care throughout the hospital, including epidural management and anesthesia for C-section. I don't know your plan for the delivery whether it a natural birth, water birth, IV sedation, nitrous supplementation, doula or epidural, but we want to meet your pain goals."   1.Was your pain managed to your expectations on prior hospitalizations?   No prior hospitalizations  2.What is your expectation for pain management during this hospitalization?     Epidural  3.How can we help you reach that goal? Epidural   Record the patient's initial score and the patient's pain goal.   Pain: 10  Pain Goal: 10 [facility] wants you to be able to say your pain was always managed very well.  Dustine Stickler 03/04/2017

## 2017-03-04 NOTE — Progress Notes (Signed)
Tracy Stout is a 21 y.o. G1P0 at 4563w4d  admitted for induction of labor due to Post dates.  Subjective: Patient comfortable with epidural.  Objective: Vitals:   03/04/17 2045 03/04/17 2050 03/04/17 2055 03/04/17 2100  BP: 125/80 121/87 133/87 121/77  Pulse: 84 94 70 91  Resp:      Temp:      TempSrc:      SpO2: 100% 99% 100% 100%  Weight:      Height:       FHT:  FHR: 120 bpm, variability: moderate,  accelerations:  Present,  decelerations:  Absent UC:   regular, every 2-3 minutes SVE:   Dilation: 6.5 Effacement (%): 70, 80 Station: -3 Exam by:: Minus Libertyhristy Leshowitz, RN Pitocin @ 14 mu/min  Labs: Lab Results  Component Value Date   WBC 7.2 03/04/2017   HGB 11.0 (L) 03/04/2017   HCT 32.8 (L) 03/04/2017   MCV 93.4 03/04/2017   PLT 154 03/04/2017    Assessment / Plan: IUP at 41 weeks. IOL for postdates. GBS negative  Plan: Continue pitocin. Recheck in 1-2 hours. Anticipate NSVD  [name] SNM 03/04/2017, 9:23 PM

## 2017-03-05 ENCOUNTER — Encounter (HOSPITAL_COMMUNITY): Payer: Self-pay

## 2017-03-05 MED ORDER — OXYCODONE HCL 5 MG PO TABS
10.0000 mg | ORAL_TABLET | ORAL | Status: DC | PRN
  Administered 2017-03-05 – 2017-03-06 (×5): 10 mg via ORAL
  Filled 2017-03-05 (×5): qty 2

## 2017-03-05 MED ORDER — ONDANSETRON HCL 4 MG PO TABS: 4.0000 mg | ORAL_TABLET | ORAL | Status: DC | PRN

## 2017-03-05 MED ORDER — METHYLERGONOVINE MALEATE 0.2 MG/ML IJ SOLN: 0.2000 mg | INTRAMUSCULAR | Status: DC | PRN

## 2017-03-05 MED ORDER — SENNOSIDES-DOCUSATE SODIUM 8.6-50 MG PO TABS
2.0000 | ORAL_TABLET | ORAL | Status: DC
  Filled 2017-03-05: qty 2

## 2017-03-05 MED ORDER — SIMETHICONE 80 MG PO CHEW: 80.0000 mg | CHEWABLE_TABLET | ORAL | Status: DC | PRN

## 2017-03-05 MED ORDER — ONDANSETRON HCL 4 MG/2ML IJ SOLN: 4.0000 mg | INTRAMUSCULAR | Status: DC | PRN

## 2017-03-05 MED ORDER — OXYCODONE HCL 5 MG PO TABS
5.0000 mg | ORAL_TABLET | ORAL | Status: DC | PRN
  Administered 2017-03-05: 5 mg via ORAL
  Filled 2017-03-05: qty 1

## 2017-03-05 MED ORDER — ZOLPIDEM TARTRATE 5 MG PO TABS: 5.0000 mg | ORAL_TABLET | Freq: Every evening | ORAL | Status: DC | PRN

## 2017-03-05 MED ORDER — WITCH HAZEL-GLYCERIN EX PADS
1.0000 | MEDICATED_PAD | CUTANEOUS | Status: DC | PRN
Start: 2017-03-05 — End: 2017-03-06
  Administered 2017-03-06: 1 via TOPICAL

## 2017-03-05 MED ORDER — MEASLES, MUMPS & RUBELLA VAC ~~LOC~~ INJ
0.5000 mL | INJECTION | Freq: Once | SUBCUTANEOUS | Status: AC
  Administered 2017-03-06: 0.5 mL via SUBCUTANEOUS
  Filled 2017-03-05 (×2): qty 0.5

## 2017-03-05 MED ORDER — DIBUCAINE 1 % RE OINT: 1.0000 "application " | TOPICAL_OINTMENT | RECTAL | Status: DC | PRN

## 2017-03-05 MED ORDER — TETANUS-DIPHTH-ACELL PERTUSSIS 5-2.5-18.5 LF-MCG/0.5 IM SUSP
0.5000 mL | Freq: Once | INTRAMUSCULAR | Status: AC
  Administered 2017-03-06: 0.5 mL via INTRAMUSCULAR
  Filled 2017-03-05: qty 0.5

## 2017-03-05 MED ORDER — PRENATAL MULTIVITAMIN CH
1.0000 | ORAL_TABLET | Freq: Every day | ORAL | Status: DC
  Administered 2017-03-06: 1 via ORAL
  Filled 2017-03-05 (×2): qty 1

## 2017-03-05 MED ORDER — DIPHENHYDRAMINE HCL 25 MG PO CAPS: 25.0000 mg | ORAL_CAPSULE | Freq: Four times a day (QID) | ORAL | Status: DC | PRN

## 2017-03-05 MED ORDER — METHYLERGONOVINE MALEATE 0.2 MG PO TABS: 0.2000 mg | ORAL_TABLET | ORAL | Status: DC | PRN

## 2017-03-05 MED ORDER — COCONUT OIL OIL: 1.0000 "application " | TOPICAL_OIL | Status: DC | PRN

## 2017-03-05 MED ORDER — ACETAMINOPHEN 325 MG PO TABS
650.0000 mg | ORAL_TABLET | ORAL | Status: DC | PRN
  Administered 2017-03-05: 650 mg via ORAL
  Filled 2017-03-05: qty 2

## 2017-03-05 MED ORDER — IBUPROFEN 600 MG PO TABS
600.0000 mg | ORAL_TABLET | Freq: Four times a day (QID) | ORAL | Status: DC
  Administered 2017-03-05 – 2017-03-06 (×6): 600 mg via ORAL
  Filled 2017-03-05 (×6): qty 1

## 2017-03-05 MED ORDER — BENZOCAINE-MENTHOL 20-0.5 % EX AERO
1.0000 "application " | INHALATION_SPRAY | CUTANEOUS | Status: DC | PRN
  Administered 2017-03-06: 1 via TOPICAL
  Filled 2017-03-05 (×2): qty 56

## 2017-03-05 NOTE — Anesthesia Postprocedure Evaluation (Signed)
Anesthesia Post Note  Patient: Rodney Cruisemily Crookshanks  Procedure(s) Performed: * No procedures listed *     Patient location during evaluation: Mother Baby Anesthesia Type: Epidural Level of consciousness: awake and alert and oriented Pain management: pain level controlled Vital Signs Assessment: post-procedure vital signs reviewed and stable Respiratory status: spontaneous breathing and nonlabored ventilation Cardiovascular status: stable Postop Assessment: no headache, epidural receding, patient able to bend at knees, adequate PO intake, no backache and no signs of nausea or vomiting Anesthetic complications: no    Last Vitals:  Vitals:   03/05/17 0522 03/05/17 0920  BP: (!) 106/56 (!) 103/57  Pulse: 87 63  Resp: 18 16  Temp: 37.6 C 36.7 C    Last Pain:  Vitals:   03/05/17 1139  TempSrc:   PainSc: 7    Pain Goal: Patients Stated Pain Goal: 3 (03/05/17 1139)               Carline Dura Hristova

## 2017-03-05 NOTE — Lactation Note (Signed)
This note was copied from a baby's chart. Lactation Consultation Note  Patient Name: Tracy Stout ZOXWR'UToday's Date: 03/05/2017 Reason for consult: Initial assessment  Initial visit at 19 hours of age.  Mom has significant hisotry of drug use, but negative on admission.  [facility] but mom on phone and asked LC to return in 10 minutes.  When LC returned baby asleep on mom STS.  Mom burped baby and LC encouraged mom to continue to keep baby comfortable.  Mom can easily express drops of colostrum and applied to nipple.   Mom reports using formula last night for her to sleep and let support person care for baby.  Mom plans to continue as needed.  LC discussed supply and demand with affect using formula has.   [facility] given and discussed.  Encouraged to feed with early cues on demand.  Early newborn behavior discussed.  Mom to call for assist as needed.     Maternal Data Has patient been taught Hand Expression?: Yes Does the patient have breastfeeding experience prior to this delivery?: No  Feeding Feeding Type: Breast Fed Nipple Type: Slow - flow Length of feed: 30 min  LATCH Score/Interventions Latch:  (LC did not observe end of feeding due to  mom requesting Lc to come back in 10 mintues)  Audible Swallowing: A few with stimulation Intervention(s): Hand expression  Type of Nipple: Everted at rest and after stimulation  Comfort (Breast/Nipple): Soft / non-tender     Hold (Positioning): No assistance needed to correctly position infant at breast. Intervention(s): Breastfeeding basics reviewed  LATCH Score: 9  Lactation Tools Discussed/Used     Consult Status Consult Status: Follow-up Date: 03/06/17 Follow-up type: In-patient    Manie Bealer, Arvella MerlesJana Lynn 03/05/2017, 9:22 PM

## 2017-03-05 NOTE — Progress Notes (Signed)
Tracy Stout is a 21 y.o. G1P0 at 3338w5d  admitted for induction of labor due to Post dates. Due date 02/21/17.  Subjective: Patient experienced bradycardia, found to be C/C +2, upon exam in knee-chest position, and bradycardia spontaneously improved.  Fhr has now recovered completely, O2 supplementation continues. We'll now begin pushing.  Objective: BP 109/84   Pulse (!) 105   Temp 98.5 F (36.9 C) (Oral)   Resp 20   Ht 5\' 7"  (1.702 m)   Wt 70.8 kg (156 lb)   LMP 04/27/2016 (Approximate)   SpO2 100%   BMI 24.43 kg/m  No intake/output data recorded. No intake/output data recorded.  FHT:  FHR: 150 bpm, variability: moderate,  accelerations:  Present,  decelerations:  Present prolonged decel with recovery. UC:   none, regular, every 2-3 minutes SVE:   Dilation: 10 Effacement (%): 80 Station: -1 Exam by:: Phillipe Clemon MD sTATION +2. Labs: Lab Results  Component Value Date   WBC 7.2 03/04/2017   HGB 11.0 (L) 03/04/2017   HCT 32.8 (L) 03/04/2017   MCV 93.4 03/04/2017   PLT 154 03/04/2017    Assessment / Plan: Induction of labor due to postterm,  progressing well on pitocin  Labor: Progressing normally, RECOVERED prolonged decel , likely with fetal descent. Preeclampsia:   Fetal Wellbeing:  Category I Pain Control:  Epidural I/D:  n/a Anticipated MOD:  NSVD and or vacuum.  [place] Cloe V 03/05/2017, 12:32 AM

## 2017-03-06 DIAGNOSIS — Z3A41 41 weeks gestation of pregnancy: Secondary | ICD-10-CM

## 2017-03-06 DIAGNOSIS — O480 Post-term pregnancy: Secondary | ICD-10-CM

## 2017-03-06 MED ORDER — POLYETHYLENE GLYCOL 3350 17 G PO PACK: 17.0000 g | PACK | Freq: Every day | ORAL | 0 refills | Status: DC

## 2017-03-06 MED ORDER — IBUPROFEN 600 MG PO TABS: 600.0000 mg | ORAL_TABLET | Freq: Four times a day (QID) | ORAL | 0 refills | Status: DC

## 2017-03-06 NOTE — Discharge Summary (Signed)
OB Discharge Summary  Patient Name: Tracy Stout DOB: 11/28/1995 MRN: [id number]  Date of admission: 03/04/2017 Delivering MD: Ria CommentFERGUSON, Tracy   Date of discharge: 03/06/2017  Admitting diagnosis: 41WKS INDUCTION Intrauterine pregnancy: 4127w5d     Secondary diagnosis:Active Problems:   Post term pregnancy, 41 weeks  Additional problems: teen pregnancy     Discharge diagnosis: Term Pregnancy Delivered                                                                      Augmentation: Pitocin, Cytotec and Foley Balloon  Complications: None  Hospital course:  Induction of Labor With Vaginal Delivery   21 y.o. yo G1P1001 at 5027w5d was admitted to the hospital 03/04/2017 for induction of labor.  Indication for induction: Postdates.  Patient had an uncomplicated labor course as follows: Membrane Rupture Time/Date: 7:40 PM ,03/04/2017   Intrapartum Procedures: Episiotomy:                                           Lacerations:  3rd degree [4]  Patient had delivery of a Viable infant.  Information for the patient's newborn:  Tracy Stout, Girl Tracy Stout [[id number]][[id number]]  Delivery Method: Vaginal, Vacuum (Extractor) (Filed from Delivery Summary)   03/05/2017  Details of delivery can be found in separate delivery note.  Patient had a routine postpartum course. Patient is discharged home 03/06/17.  Physical exam  Vitals:   03/05/17 0522 03/05/17 0920 03/05/17 1728 03/06/17 0538  BP: (!) 106/56 (!) 103/57 112/67 (!) 92/41  Pulse: 87 63 69 61  Resp: 18 16 18 18   Temp: 99.6 F (37.6 C) 98.1 F (36.7 C) 97.8 F (36.6 C) 98.5 F (36.9 C)  TempSrc: Oral Oral Oral Oral  SpO2:  98% 100%   Weight:      Height:       General: alert Lochia: appropriate Uterine Fundus: firm Incision: N/A DVT Evaluation: No evidence of DVT seen on physical exam. Labs: Lab Results  Component Value Date   WBC 7.2 03/04/2017   HGB 11.0 (L) 03/04/2017   HCT 32.8 (L) 03/04/2017   MCV 93.4 03/04/2017   PLT 154  03/04/2017   CMP Latest Ref Rng & Units 12/23/2016  Glucose 65 - 99 mg/dL 782(N127(H)  BUN 6 - 20 mg/dL 6  Creatinine 5.620.44 - 1.301.00 mg/dL 8.650.46  Sodium 784135 - 696145 mmol/L 134(L)  Potassium 3.5 - 5.1 mmol/L 4.0  Chloride 101 - 111 mmol/L 105  CO2 22 - 32 mmol/L 23  Calcium 8.9 - 10.3 mg/dL 2.9(B8.8(L)  Total Protein 6.5 - 8.1 g/dL 6.0(L)  Total Bilirubin 0.3 - 1.2 mg/dL 0.5  Alkaline Phos 38 - 126 U/L 69  AST 15 - 41 U/L 16  ALT 14 - 54 U/L 9(L)    Discharge instruction: per After Visit Summary and "Baby and Me Booklet".  After Visit Meds:  Allergies as of 03/06/2017   No Known Allergies     Medication List    TAKE these medications   acetaminophen 500 MG tablet Commonly known as:  TYLENOL Take 1,000 mg by mouth every 6 (six) hours as needed  for mild pain.   cephALEXin 500 MG capsule Commonly known as:  KEFLEX Take 1 capsule (500 mg total) by mouth at bedtime.   escitalopram 20 MG tablet Commonly known as:  LEXAPRO Take 1 tablet (20 mg total) by mouth daily.   flintstones complete 60 MG chewable tablet Chew 2 tablets by mouth daily.   ibuprofen 600 MG tablet Commonly known as:  ADVIL,MOTRIN Take 1 tablet (600 mg total) by mouth every 6 (six) hours.       Diet: routine diet  Activity: Advance as tolerated. Pelvic rest for 6 weeks.   Outpatient follow up:3 weeks Follow up Appt:Future Appointments Date Time Provider Department Center  03/31/2017 1:00 PM Cheral Marker, CNM [facility]   Follow up visit: No Follow-up on file.  Postpartum contraception: Plans IUD  Newborn Data: Live born female  Birth Weight: 9 lb 7.3 oz (4289 g) APGAR: 8, 9  Baby Feeding: Bottle and Breast Disposition:home with mother   03/06/2017 Tracy Bossier, MD

## 2017-03-06 NOTE — Progress Notes (Signed)
Post discharge chart review completed.  

## 2017-03-06 NOTE — Lactation Note (Signed)
This note was copied from a baby's chart. Lactation Consultation Note  Patient Name: Tracy Stout ZOXWR'UToday's Date: 03/06/2017 Reason for consult: Follow-up assessment Baby at 33 hr of life. Upon entry baby had a shallow latch on the L breast. When mom took baby off, there was a compression stripe. Mom is reporting nipple soreness, no skin break down was noted when nipple returned to "normal" shape. Demonstrated how to position baby in cross cradle to get a deeper latch on the R breast. Baby has a wide gape, flanges lips well, but was sleepy at the breast. MGM stated baby has been on the breast for about 10 minutes before lactation arrived. Several swallows were noted. Showed mom how to stimulate baby to take longer bursts of sucking. Discussed baby behavior, feeding frequency, baby belly size, voids, wt loss, breast changes, and nipple care. Mom is aware of lactation services and support group. She will call as needed.    Maternal Data    Feeding Feeding Type: Breast Fed Length of feed: 25 min  LATCH Score/Interventions Latch: Grasps breast easily, tongue down, lips flanged, rhythmical sucking.  Audible Swallowing: A few with stimulation Intervention(s): Skin to skin;Alternate breast massage  Type of Nipple: Everted at rest and after stimulation Intervention(s): No intervention needed  Comfort (Breast/Nipple): Filling, red/small blisters or bruises, mild/mod discomfort  Problem noted: Mild/Moderate discomfort Interventions (Mild/moderate discomfort): Hand expression  Hold (Positioning): Assistance needed to correctly position infant at breast and maintain latch. Intervention(s): Position options;Support Pillows  LATCH Score: 7  Lactation Tools Discussed/Used WIC Program: Yes   Consult Status Consult Status: Follow-up Date: 03/07/17 Follow-up type: In-patient    [name] 03/06/2017, 11:31 AM

## 2017-03-06 NOTE — Clinical Social Work Maternal (Signed)
 CLINICAL SOCIAL WORK MATERNAL/CHILD NOTE  Patient Details  Name: Tracy Stout MRN: [id number] Date of Birth: 05/28/1996  Date:  03/06/2017  Clinical Social Worker Initiating Note:  Nikesh Teschner Boyd-Gilyard Date/ Time Initiated:  03/06/17/1309     Child's Name:  Tracy Stout   Legal Guardian:  Mother (FOB is Jordan Stout 01/31/1990)   Need for Interpreter:  None   Date of Referral:  03/06/17     Reason for Referral:  Behavioral Health Issues, including SI , Current Substance Use/Substance Use During Pregnancy  (hx of polysubstance. )   Referral Source:  Central Nursery   Address:  [address] [place] [address]  Phone number:  [phone]   Household Members:  Self, Siblings, Parents   Natural Supports (not living in the home):  Immediate Family, Extended Family (FOB's family will also be a source of support. )   Professional Supports: Therapist (MOB is an establish patinet at [facility])   Employment: Unemployed   Type of Work:     Education:  High school graduate   Financial Resources:  Medicaid   Other Resources:  WIC, Food Stamps    Cultural/Religious Considerations Which May Impact Care:  Per MOB's Face Sheet, MOB is [employer].   Strengths:  Ability to meet basic needs , Pediatrician chosen , Home prepared for child , Understanding of illness, Compliance with medical plan    Risk Factors/Current Problems:  Substance Use , Mental Health Concerns    Cognitive State:  Able to Concentrate , Alert , Insightful , Linear Thinking    Mood/Affect:  Calm , Tearful , Relaxed , Interested , Comfortable    CSW Assessment: CSW met with MOB to complete an assessment for MH and SA hx.  When CSW arrived, MOB was resting in bed engaging in skin to skin with infant.  MOB was polite, forthcoming, and receptive to meeting with CSW.  MOB gave CSW permission to complete the assessment while MOB's mother was present.   CSW inquired  about MOB's MH hx and MOB acknowledged a hx of anxiety, depression, and self mutilation. MOB reported MOB is currently taking Lexapro and is an established patient with [facility] (Joe Hughes is MOB's therapist). MOB admits to cutting in January (2018) and was open to discussing her Behavioral Health hospitalizations. MOB did not present with any acute mental health symptoms, and presented with insight and self-awareness related to her mental health needs. MOB denied SI and HI. CSW educated MOB about PPD. CSW informed MOB of possible supports and interventions to decrease PPD.  CSW also encouraged MOB to seek medical attention if needed for increased signs and symptoms for PPD. MOB expressed that MOB feels comfortable seeking help if help is needed.  MOB's mother also communicated being knowledgeable about PPD and her willingness to assist MOB and infant when needed. MOB has an outpatients appointment scheduled for 03/16/17.   When CSW asked about MOB's substance use, MOB admitted utilizing marijuana throughout pregnancy.  MOB communicated that MOB smoked marijuana to assist with increasing MOB's appetite and to help MOB sleep.  MOB reported MOB's last use of marijuana was May 2018.  CSW thanked MOB for MOB's honesty and inquired about MOB's positive UDS for oxycodone as well.  MOB reported that MOB took a friend's medication to assist with MOB's pain.  MOB became tearful and expressed feeling of guilt and sadness. CSW validated MOB's feelings and encouraged MOB to make better decisions.  CSW offered   MOB with SA resources and MOB declined.  MOB reported that MOB does not have a SA problem. CSW informed MOB of hospital's SA policy and shared with MOB that the infant's UDS is negative. CSW made MOB aware that CSW will continue to monitor the infant's CDS. CSW informed MOB that if the infant's CDS is positive without an explanation, CSW will make a report to [place] CPS.  MOB did not have any  questions regarding the hospital's policy. Although MOB was tearful while acknowledging MOB's SA use, MOB took full responsibility for MOB's actions.   MOB reports a wealth of support from MOB's and FOB's immediate and extended family members.  MOB communicated that MOB has all necessary items for infant and MOB's feels prepared to parent. CSW provided MOB with SIDS education and MOB actively listen and asked appropriate questions. CSW thanked MOB for meeting with CSW and provided MOB with CSW's contact information.   CSW Plan/Description:  Information/Referral to Community Resources , Patient/Family Education , No Further Intervention Required/No Barriers to Discharge (CSW will monitor infant CDS and will make a report to [place] CPS if warranted. )   Samer Dutton Boyd-Gilyard, MSW, LCSW Clinical Social Work [phone]  Laree Garron D BOYD-GILYARD, LCSW 03/06/2017, 1:13 PM  

## 2017-03-06 NOTE — Discharge Instructions (Signed)

## 2017-03-08 ENCOUNTER — Telehealth: Payer: Self-pay | Admitting: Advanced Practice Midwife

## 2017-03-08 NOTE — Telephone Encounter (Signed)
LMOVM that she can take Tylenol if she had not already and is safe for breastfeeding. Advised to continue to push fluids and eat small frequent meals. If headache is not relieved by Tylenol and is associated with blurred vision, [facility] or call us in the morning for an appointment.

## 2017-03-09 ENCOUNTER — Telehealth: Payer: Self-pay | Admitting: *Deleted

## 2017-03-09 ENCOUNTER — Ambulatory Visit: Payer: Medicaid Other | Admitting: Obstetrics and Gynecology

## 2017-03-09 NOTE — Telephone Encounter (Signed)
Postpartum score is 17 per home visit nurse and very anxious. Informed her patient has appointment today.

## 2017-03-09 NOTE — Telephone Encounter (Signed)
Patient states she still has a headache not relieved by Tylenol and some blurred vision but thinks it could be from not wearing glasses. She is also having chills but no fever. States she cannot come until this afternoon but will work in with Dr Emelda FearFerguson.

## 2017-03-20 ENCOUNTER — Ambulatory Visit: Payer: Medicaid Other | Admitting: Obstetrics and Gynecology

## 2017-03-20 ENCOUNTER — Telehealth: Payer: Self-pay | Admitting: Obstetrics & Gynecology

## 2017-03-20 NOTE — Telephone Encounter (Signed)
Patient states she is having periods of severe depression. She is not having thoughts of hurting baby or anyone else but has thought about hurting herself. She does not currently have a plan. Advised patient that if she felt like she wanted to actually do something to herself, she needed to call 911 or go to the hospital. Verbalized understanding. States she needs an early or late appointment because her mom has to work and is her only ride. Will w/i in am with Tracy Stout.

## 2017-03-21 ENCOUNTER — Ambulatory Visit (INDEPENDENT_AMBULATORY_CARE_PROVIDER_SITE_OTHER): Payer: Medicaid Other | Admitting: Women's Health

## 2017-03-21 ENCOUNTER — Encounter: Payer: Self-pay | Admitting: Women's Health

## 2017-03-21 VITALS — BP 108/62 | HR 68 | Wt 134.0 lb

## 2017-03-21 DIAGNOSIS — F53 Postpartum depression: Secondary | ICD-10-CM

## 2017-03-21 DIAGNOSIS — Z1389 Encounter for screening for other disorder: Secondary | ICD-10-CM | POA: Diagnosis not present

## 2017-03-21 DIAGNOSIS — F332 Major depressive disorder, recurrent severe without psychotic features: Secondary | ICD-10-CM

## 2017-03-21 DIAGNOSIS — Z7289 Other problems related to lifestyle: Secondary | ICD-10-CM | POA: Insufficient documentation

## 2017-03-21 DIAGNOSIS — O99345 Other mental disorders complicating the puerperium: Principal | ICD-10-CM

## 2017-03-21 MED ORDER — CITALOPRAM HYDROBROMIDE 20 MG PO TABS: 20.0000 mg | ORAL_TABLET | Freq: Every day | ORAL | 2 refills | Status: DC

## 2017-03-21 NOTE — Progress Notes (Signed)
   [facility] Visit  Patient name: Tracy Stout MRN [id number]  Date of birth: 09/02/1996  CC & HPI:  Tracy Cruisemily Grandfield is a 21 y.o. 781P1001 Caucasian female 16d s/p VAVB, presenting today for report of depression. H/o depression/anxiety prior to pregnancy, was on lexapro 20mg  during pregnancy- states it makes her feel worse. H/o cutting, none since earlier in pregnancy. Does have thoughts of harming self, but no plan. States if it got to that point she would self-commit her self, b/c she knows that would be better than having to be involuntarily committed. Eating well, sleeps ok when baby sleeping. Does have help at home, lives with her mom who is supportive. No HI/II. Has tried zoloft in past which also mad her feel worse. Has appt w/ [facility] in [place] but not until end of July and feels she needs to be seen somewhere sooner- prefers [place]. Bottlefeeding. Undecided about contraception.  No LMP recorded. Pertinent History Reviewed:  Medical & Surgical Hx:   Past medical, surgical, family, and social history reviewed in electronic medical record Medications: Reviewed & Updated - see associated section Allergies: Reviewed in electronic medical record  Objective Findings:  Vitals: BP 108/62 (BP Location: Right Arm, Patient Position: Sitting, Cuff Size: Normal)   Pulse 68   Wt 134 lb (60.8 kg)   Breastfeeding? No   BMI 20.99 kg/m  Body mass index is 20.99 kg/m.  Physical Examination: General appearance - alert, well appearing, and in no distress  No results found for this or any previous visit (from the past 24 hour(s)).   Assessment & Plan:  A:   16d s/p VAVB  Bottlefeeding  Depression/anxiety  Thoughts of self-harm, no plan  H/O cutting  P:  Stop Lexapro since it is making her feel worse  Rx celexa 20mg  daily  [facility], pt to let us know if she hasn't heard from them w/in the week  If develops plan to harm self, to go  straight to ED  Return for As scheduled, then 4wks for f/u .  Marge DuncansBooker, [place] Benyo Randall CNM, [facility]NP-BC 03/21/2017 9:18 AM

## 2017-03-21 NOTE — Patient Instructions (Addendum)
Stop Lexapro, start Celexa  Perinatal Depression When a woman feels excessive sadness, anger, or anxiety during pregnancy or during the first 12 months after she gives birth, she has a condition called perinatal depression. Depression can interfere with work, school, relationships, and other everyday activities. If it is not managed properly, it can also cause problems in the mother and her baby. Sometimes, perinatal depression is left untreated because symptoms are thought to be normal mood swings during and right after pregnancy. If you have symptoms of depression, it is important to talk with your health care provider. What are the causes? The exact cause of this condition is not known. Hormonal changes during and after pregnancy may play a role in causing perinatal depression. What increases the risk? You are more likely to develop this condition if:  You have a personal or family history of depression, anxiety, or mood disorders.  You experience a stressful life event during pregnancy, such as the death of a loved one.  You have a lot of regular life stress.  You do not have support from family members or loved ones, or you are in an abusive relationship.  What are the signs or symptoms? Symptoms of this condition include:  Feeling sad or hopeless.  Feelings of guilt.  Feeling irritable or overwhelmed.  Changes in your appetite.  Lack of energy or motivation.  Sleep problems.  Difficulty concentrating or completing tasks.  Loss of interest in hobbies or relationships.  Headaches or stomach problems that do not go away.  How is this diagnosed? This condition is diagnosed based on a physical exam and mental evaluation. In some cases, your health care provider may use a depression screening tool. These tools include a list of questions that can help a health care provider diagnose depression. Your health care provider may refer you to a mental health expert who specializes  in depression. How is this treated? This condition may be treated with:  Medicines. Your health care provider will only give you medicines that have been proven safe for pregnancy and breastfeeding.  Talk therapy with a mental health professional to help change your patterns of thinking (cognitive behavioral therapy).  Support groups.  Brain stimulation or light therapies.  Stress reduction therapies, such as mindfulness.  Follow these instructions at home: Lifestyle  Do not use any products that contain nicotine or tobacco, such as cigarettes and e-cigarettes. If you need help quitting, ask your health care provider.  Do not use alcohol when you are pregnant. After your baby is born, limit alcohol intake to no more than 1 drink a day. One drink equals 12 oz of beer, 5 oz of wine, or 1 oz of hard liquor.  Consider joining a support group for new mothers. Ask your health care provider for recommendations.  Take good care of yourself. Make sure you: ? Get plenty of sleep. If you are having trouble sleeping, talk with your health care provider. ? Eat a healthy diet. This includes plenty of fruits and vegetables, whole grains, and lean proteins. ? Exercise regularly, as told by your health care provider. Ask your health care provider what exercises are safe for you. General instructions  Take over-the-counter and prescription medicines only as told by your health care provider.  Talk with your partner or family members about your feelings during pregnancy. Share any concerns or anxieties that you may have.  Ask for help with tasks or chores when you need it. Ask friends and family members to  provide meals, watch your children, or help with cleaning.  Keep all follow-up visits as told by your health care provider. This is important. Contact a health care provider if:  You (or people close to you) notice that you have any symptoms of depression.  You have depression and your  symptoms get worse.  You experience side effects from medicines, such as nausea or sleep problems. Get help right away if:  You feel like hurting yourself, your baby, or someone else. If you ever feel like you may hurt yourself or others, or have thoughts about taking your own life, get help right away. You can go to your nearest emergency department or call:  Your local emergency services (911 in the [place]  A suicide crisis helpline, such as the National Suicide Prevention Lifeline at (956) 706-30241-628-233-5654. This is open 24 hours a day.  Summary  Perinatal depression is when a woman feels excessive sadness, anger, or anxiety during pregnancy or during the first 12 months after she gives birth.  If perinatal depression is not treated, it can lead to health problems for the mother and her baby.  This condition is treated with medicines, talk therapy, stress reduction therapies, or a combination of two or more treatments.  Talk with your partner or family members about your feelings. Do not be afraid to ask for help. This information is not intended to replace advice given to you by your health care provider. Make sure you discuss any questions you have with your health care provider. Document Released: 11/09/2016 Document Revised: 11/09/2016 Document Reviewed: 11/09/2016 Elsevier Interactive Patient Education  [employer]2018 [employer]

## 2017-03-28 ENCOUNTER — Encounter: Payer: Medicaid Other | Admitting: Family

## 2017-03-31 ENCOUNTER — Ambulatory Visit (INDEPENDENT_AMBULATORY_CARE_PROVIDER_SITE_OTHER): Payer: Medicaid Other | Admitting: Women's Health

## 2017-03-31 ENCOUNTER — Encounter: Payer: Self-pay | Admitting: Women's Health

## 2017-03-31 DIAGNOSIS — F418 Other specified anxiety disorders: Secondary | ICD-10-CM | POA: Diagnosis not present

## 2017-03-31 MED ORDER — CITALOPRAM HYDROBROMIDE 40 MG PO TABS: 40.0000 mg | ORAL_TABLET | Freq: Every day | ORAL | 6 refills | Status: DC

## 2017-03-31 NOTE — Patient Instructions (Signed)
NO SEX UNTIL AFTER YOU GET YOUR BIRTH CONTROL   [facility] [phone]   Major Depressive Disorder, Adult Major depressive disorder (MDD) is a mental health condition. It may also be called clinical depression or unipolar depression. MDD usually causes feelings of sadness, hopelessness, or helplessness. MDD can also cause physical symptoms. It can interfere with work, school, relationships, and other everyday activities. MDD may be mild, moderate, or severe. It may occur once (single episode major depressive disorder) or it may occur multiple times (recurrent major depressive disorder). What are the causes? The exact cause of this condition is not known. MDD is most likely caused by a combination of things, which may include:  Genetic factors. These are traits that are passed along from parent to child.  Individual factors. Your personality, your behavior, and the way you handle your thoughts and feelings may contribute to MDD. This includes personality traits and behaviors learned from others.  Physical factors, such as: ? Differences in the part of your brain that controls emotion. This part of your brain may be different than it is in people who do not have MDD. ? Long-term (chronic) medical or psychiatric illnesses.  Social factors. Traumatic experiences or major life changes may play a role in the development of MDD.  What increases the risk? This condition is more likely to develop in women. The following factors may also make you more likely to develop MDD:  A family history of depression.  Troubled family relationships.  Abnormally low levels of certain brain chemicals.  Traumatic events in childhood, especially abuse or the loss of a parent.  Being under a lot of stress, or long-term stress, especially from upsetting life experiences or losses.  A history of: ? Chronic physical illness. ? Other mental health disorders. ? Substance abuse.  Poor living  conditions.  Experiencing social exclusion or discrimination on a regular basis.  What are the signs or symptoms? The main symptoms of MDD typically include:  Constant depressed or irritable mood.  Loss of interest in things and activities.  MDD symptoms may also include:  Sleeping or eating too much or too little.  Unexplained weight change.  Fatigue or low energy.  Feelings of worthlessness or guilt.  Difficulty thinking clearly or making decisions.  Thoughts of suicide or of harming others.  Physical agitation or weakness.  Isolation.  Severe cases of MDD may also occur with other symptoms, such as:  Delusions or hallucinations, in which you imagine things that are not real (psychotic depression).  Low-level depression that lasts at least a year (chronic depression or persistent depressive disorder).  Extreme sadness and hopelessness (melancholic depression).  Trouble speaking and moving (catatonic depression).  How is this diagnosed? This condition may be diagnosed based on:  Your symptoms.  Your medical history, including your mental health history. This may involve tests to evaluate your mental health. You may be asked questions about your lifestyle, including any drug and alcohol use, and how long you have had symptoms of MDD.  A physical exam.  Blood tests to rule out other conditions.  You must have a depressed mood and at least four other MDD symptoms most of the day, nearly every day in the same 2-week timeframe before your health care provider can confirm a diagnosis of MDD. How is this treated? This condition is usually treated by mental health professionals, such as psychologists, psychiatrists, and clinical social workers. You may need more than one type of treatment. Treatment may include:  Psychotherapy. This is also called talk therapy or counseling. Types of psychotherapy include: ? Cognitive behavioral therapy (CBT). This type of therapy  teaches you to recognize unhealthy feelings, thoughts, and behaviors, and replace them with positive thoughts and actions. ? Interpersonal therapy (IPT). This helps you to improve the way you relate to and communicate with others. ? Family therapy. This treatment includes members of your family.  Medicine to treat anxiety and depression, or to help you control certain emotions and behaviors.  Lifestyle changes, such as: ? Limiting alcohol and drug use. ? Exercising regularly. ? Getting plenty of sleep. ? Making healthy eating choices. ? Spending more time outdoors.  Treatments involving stimulation of the brain can be used in situations with extremely severe symptoms, or when medicine or other therapies do not work over time. These treatments include electroconvulsive therapy, transcranial magnetic stimulation, and vagal nerve stimulation. Follow these instructions at home: Activity  Return to your normal activities as told by your health care provider.  Exercise regularly and spend time outdoors as told by your health care provider. General instructions  Take over-the-counter and prescription medicines only as told by your health care provider.  Do not drink alcohol. If you drink alcohol, limit your alcohol intake to no more than 1 drink a day for nonpregnant women and 2 drinks a day for men. One drink equals 12 oz of beer, 5 oz of wine, or 1 oz of hard liquor. Alcohol can affect any antidepressant medicines you are taking. Talk to your health care provider about your alcohol use.  Eat a healthy diet and get plenty of sleep.  Find activities that you enjoy doing, and make time to do them.  Consider joining a support group. Your health care provider may be able to recommend a support group.  Keep all follow-up visits as told by your health care provider. This is important. Where to find more information: [employer] on Mental Illness  www.nami.org  [employer]  of Mental Health  http://www.maynard.net/www.nimh.nih.gov  National Suicide Prevention Lifeline  1-800-273-TALK 858-089-4674(8255). This is free, 24-hour help.  Contact a health care provider if:  Your symptoms get worse.  You develop new symptoms. Get help right away if:  You self-harm.  You have serious thoughts about hurting yourself or others.  You see, hear, taste, smell, or feel things that are not present (hallucinate). This information is not intended to replace advice given to you by your health care provider. Make sure you discuss any questions you have with your health care provider. Document Released: 01/07/2013 Document Revised: 05/19/2016 Document Reviewed: 03/23/2016 Elsevier Interactive Patient Education  2017 [employer]  Levonorgestrel intrauterine device (IUD) What is this medicine? LEVONORGESTREL IUD (LEE voe nor jes trel) is a contraceptive (birth control) device. The device is placed inside the uterus by a healthcare professional. It is used to prevent pregnancy. This device can also be used to treat heavy bleeding that occurs during your period. This medicine may be used for other purposes; ask your health care provider or pharmacist if you have questions. COMMON BRAND NAME(S): Cameron AliKyleena, LILETTA, Mirena, Skyla What should I tell my health care provider before I take this medicine? They need to know if you have any of these conditions: -abnormal Pap smear -cancer of the breast, uterus, or cervix -diabetes -endometritis -genital or pelvic infection now or in the past -have more than one sexual partner or your partner has more than one partner -heart disease -history of an ectopic or  tubal pregnancy -immune system problems -IUD in place -liver disease or tumor -problems with blood clots or take blood-thinners -seizures -use intravenous drugs -uterus of unusual shape -vaginal bleeding that has not been explained -an unusual or allergic reaction to levonorgestrel, other hormones,  silicone, or polyethylene, medicines, foods, dyes, or preservatives -pregnant or trying to get pregnant -breast-feeding How should I use this medicine? This device is placed inside the uterus by a health care professional. Talk to your pediatrician regarding the use of this medicine in children. Special care may be needed. Overdosage: If you think you have taken too much of this medicine contact a poison control center or emergency room at once. NOTE: This medicine is only for you. Do not share this medicine with others. What if I miss a dose? This does not apply. Depending on the brand of device you have inserted, the device will need to be replaced every 3 to 5 years if you wish to continue using this type of birth control. What may interact with this medicine? Do not take this medicine with any of the following medications: -amprenavir -bosentan -fosamprenavir This medicine may also interact with the following medications: -aprepitant -armodafinil -barbiturate medicines for inducing sleep or treating seizures -bexarotene -boceprevir -griseofulvin -medicines to treat seizures like carbamazepine, ethotoin, felbamate, oxcarbazepine, phenytoin, topiramate -modafinil -pioglitazone -rifabutin -rifampin -rifapentine -some medicines to treat HIV infection like atazanavir, efavirenz, indinavir, lopinavir, nelfinavir, tipranavir, ritonavir -St. John's wort -warfarin This list may not describe all possible interactions. Give your health care provider a list of all the medicines, herbs, non-prescription drugs, or dietary supplements you use. Also tell them if you smoke, drink alcohol, or use illegal drugs. Some items may interact with your medicine. What should I watch for while using this medicine? Visit your doctor or health care professional for regular check ups. See your doctor if you or your partner has sexual contact with others, becomes HIV positive, or gets a sexual transmitted  disease. This product does not protect you against HIV infection (AIDS) or other sexually transmitted diseases. You can check the placement of the IUD yourself by reaching up to the top of your vagina with clean fingers to feel the threads. Do not pull on the threads. It is a good habit to check placement after each menstrual period. Call your doctor right away if you feel more of the IUD than just the threads or if you cannot feel the threads at all. The IUD may come out by itself. You may become pregnant if the device comes out. If you notice that the IUD has come out use a backup birth control method like condoms and call your health care provider. Using tampons will not change the position of the IUD and are okay to use during your period. This IUD can be safely scanned with magnetic resonance imaging (MRI) only under specific conditions. Before you have an MRI, tell your healthcare provider that you have an IUD in place, and which type of IUD you have in place. What side effects may I notice from receiving this medicine? Side effects that you should report to your doctor or health care professional as soon as possible: -allergic reactions like skin rash, itching or hives, swelling of the face, lips, or tongue -fever, flu-like symptoms -genital sores -high blood pressure -no menstrual period for 6 weeks during use -pain, swelling, warmth in the leg -pelvic pain or tenderness -severe or sudden headache -signs of pregnancy -stomach cramping -sudden shortness of breath -trouble with  balance, talking, or walking -unusual vaginal bleeding, discharge -yellowing of the eyes or skin Side effects that usually do not require medical attention (report to your doctor or health care professional if they continue or are bothersome): -acne -breast pain -change in sex drive or performance -changes in weight -cramping, dizziness, or faintness while the device is being inserted -headache -irregular  menstrual bleeding within first 3 to 6 months of use -nausea This list may not describe all possible side effects. Call your doctor for medical advice about side effects. You may report side effects to FDA at 1-800-FDA-1088. Where should I keep my medicine? This does not apply. NOTE: This sheet is a summary. It may not cover all possible information. If you have questions about this medicine, talk to your doctor, pharmacist, or health care provider.  2018 Elsevier/Gold Standard (2016-06-24 14:14:56)

## 2017-03-31 NOTE — Progress Notes (Signed)
Subjective:    Tracy Stout is a 21 y.o. [id number] Caucasian female who presents for a postpartum visit. She is 5 weeks postpartum following a spontaneous vaginal delivery and vacuum, low at 41.5 gestational weeks after IOL for postdates. Anesthesia: epidural. I have fully reviewed the prenatal and intrapartum course. Postpartum course has been complicated by depression. Has h/o depression/anxiety even prior to pregnancy. Has been on multiple different meds that didn't work. She was seen on 6/26 and rx'd Celexa 20mg  daily- states starting to help some, but still 'feels crazy', still has thoughts of harming herself- although she states she would never act on the thoughts. Does not have a plan. Does state she would go to hospital and voluntarily commit herself if it got to that point. [facility] 6/26 [facility]- pt states she couldn't make it b/c she doesn't drive- has to rely on her mother. Has appt at end of July w/ [facility] in [place] that her mother set up. Mother is present w/ pt today and states she agrees she needs to be seen earlier. She will try to get her in earlier in [place], if not able to, the mother will [facility] to schedule appt. Pt feels she needs to increase celexa dose.  Baby's course has been uncomplicated. Baby is feeding by bottle. Bleeding no bleeding. Bowel function is normal. Bladder function is normal. Patient is not sexually active. Last sexual activity: prior to birth of baby. Contraception method is wants IUD. Postpartum depression screening: positive. Score 20, however it is improved from last EPDS on 6/26 of 24 .  Last pap <21yo.  The following portions of the patient's history were reviewed and updated as appropriate: allergies, current medications, past medical history, past surgical history and problem list.  Review of Systems Pertinent items are noted in HPI.   Vitals:   03/31/17 1308  BP: 100/60  Pulse: 72  Weight: 131  lb (59.4 kg)   [name] last menstrual period was 04/27/2016 (approximate).  Objective:   General:  alert, cooperative and no distress   Breasts:  deferred, no complaints  Lungs: clear to auscultation bilaterally  Heart:  regular rate and rhythm  Abdomen: soft, nontender   Vulva: normal  Vagina: normal vagina  Cervix:  closed  Corpus: Well-involuted  Adnexa:  Non-palpable  Rectal Exam: No hemorrhoids        Assessment:   Postpartum exam 5 wks s/p VAVB after IOL for postdates Bottlefeeding Depression/anxiety Depression screening Contraception counseling   Plan:  Contraception: abstinence until IUD insertion Follow up in: next week for IUD insertion, or earlier if needed Increase celexa to 40mg  daily [facility] Keep scheduled appt at [facility] at end of month Go to ED if develops plan to harm self/thinks she may act on her thoughts  Marge DuncansBooker, Lysle Yero Randall CNM, [facility]-BC 03/31/2017 1:25 PM

## 2017-04-12 ENCOUNTER — Telehealth: Payer: Self-pay | Admitting: Obstetrics and Gynecology

## 2017-04-12 NOTE — Telephone Encounter (Signed)
-----   Message from Tilda BurrowJohn Ferguson V, MD sent at 03/09/2017 11:28 PM EDT ----- Regarding: FW: vacuum delivery by [email or web address] on call for Fac Prac Please let me know how this was resolved. It was supposed to be a Designer, industrial/productaculty Practice charge. ----- Message ----- From: Tilda BurrowFerguson, John V, MD Sent: 03/07/2017   4:39 PM To: Tilda BurrowJohn Ferguson V, MD, [facility] Billing/Coding Subject: vacuum delivery by [email or web address] on call for Fa#

## 2017-04-13 ENCOUNTER — Ambulatory Visit: Payer: Medicaid Other | Admitting: Advanced Practice Midwife

## 2017-04-19 ENCOUNTER — Ambulatory Visit: Payer: Medicaid Other | Admitting: Advanced Practice Midwife

## 2017-05-24 ENCOUNTER — Encounter: Payer: Self-pay | Admitting: *Deleted

## 2017-05-24 ENCOUNTER — Ambulatory Visit: Payer: Medicaid Other | Admitting: Advanced Practice Midwife

## 2017-06-05 ENCOUNTER — Emergency Department (HOSPITAL_COMMUNITY)
Admission: EM | Admit: 2017-06-05 | Discharge: 2017-06-05 | Disposition: A | Discharge status: Home / Self Care | Payer: Medicaid Other | Attending: Emergency Medicine | Admitting: Emergency Medicine

## 2017-06-05 ENCOUNTER — Encounter (HOSPITAL_COMMUNITY): Payer: Self-pay | Admitting: Emergency Medicine

## 2017-06-05 DIAGNOSIS — R21 Rash and other nonspecific skin eruption: Secondary | ICD-10-CM | POA: Insufficient documentation

## 2017-06-05 DIAGNOSIS — Z79899 Other long term (current) drug therapy: Secondary | ICD-10-CM | POA: Insufficient documentation

## 2017-06-05 DIAGNOSIS — F17210 Nicotine dependence, cigarettes, uncomplicated: Secondary | ICD-10-CM | POA: Insufficient documentation

## 2017-06-05 DIAGNOSIS — L509 Urticaria, unspecified: Secondary | ICD-10-CM | POA: Insufficient documentation

## 2017-06-05 MED ORDER — METHYLPREDNISOLONE SODIUM SUCC 125 MG IJ SOLR
125.0000 mg | Freq: Once | INTRAMUSCULAR | Status: AC
  Administered 2017-06-05: 125 mg via INTRAMUSCULAR
  Filled 2017-06-05: qty 2

## 2017-06-05 MED ORDER — PREDNISONE 10 MG PO TABS: 40.0000 mg | ORAL_TABLET | Freq: Every day | ORAL | 0 refills | Status: AC

## 2017-06-05 MED ORDER — FAMOTIDINE 20 MG PO TABS: 20.0000 mg | ORAL_TABLET | Freq: Two times a day (BID) | ORAL | 0 refills | Status: DC

## 2017-06-05 NOTE — ED Notes (Signed)
Pt c/o rash that started this am. Red raised circular rash noted to face, neck, chest, arms and abd. Pt denies any changes in medications, foods or laundry detergent. C/o rash itching. Denies anyone else in home having rash

## 2017-06-05 NOTE — ED Triage Notes (Signed)
Pt c/o rash noted to right arm yesterday.woke this am to rash on abd, both arms and left side of face. Red circular bumps noted to these areas.

## 2017-06-05 NOTE — ED Provider Notes (Signed)
[facility]-EM[facility]Y DEPT Provider Note   CSN: [id number] Arrival date & time: 06/05/17  1156     History   Chief Complaint Chief Complaint  Patient presents with  . Rash    HPI Tracy Stout is a 21 y.o. female.  HPI  Patient presents to ED for evaluation of rash for the past day. States that it initially started on her abdomen last week but has since progressed to her face, bilateral arms, left lower leg. She was seen at an urgent care several days ago but was unable to get prescriptions filled. She has been taking Benadryl intermittently since symptoms began. States that the rash is itchy. She denies any new soaps, detergents, medications, foods, lotions. Denies any outdoor exposure or insect bites. No sick contacts with similar symptoms. She denies any lip swelling, trouble breathing or trouble swallowing.  Past Medical History:  Diagnosis Date  . ADHD (attention deficit hyperactivity disorder)   . Anxiety   . Bipolar 1 disorder (HCC)   . Depression   . Pneumonia   . Supervision of normal pregnancy 06/30/2016    [facility] at   6+2 weeks FOB  [place] 21 yo bm 4th Dating By  US  Pap   NA GC/CT Initial:                36+wks: Genetic Screen NT/IT:  CF screen  Anatomic US  Flu vaccine  Tdap Recommended ~ 28wks Glucose Screen  2 hr GBS  Feed Preference  Contraception  Circumcision  Childbirth Classes  Pediatrician    . UTI (urinary tract infection) during pregnancy 09/2016    Patient Active Problem List   Diagnosis Date Noted  . H/O Deliberate self-cutting 03/21/2017  . Drug use 11/07/2016  . Anemia   . History of pyelonephritis during pregnancy 10/06/2016  . 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 Generalized anxiety disorder 10/25/2014  . 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  tubes in ears      OB History    Gravida Para Term Preterm AB Living   1 1 1     1    SAB TAB Ectopic Multiple Live Births         0 1       Home Medications    Prior to Admission medications   Medication Sig Start Date End Date Taking? Authorizing Provider  acetaminophen (TYLENOL) 500 MG tablet Take 1,000 mg by mouth every 6 (six) hours as needed for mild pain.    [provider]  bisacodyl (DULCOLAX) 5 MG EC tablet Take 5 mg by mouth daily as needed for moderate constipation.    [provider]  citalopram (CELEXA) 40 MG tablet Take 1 tablet (40 mg total) by mouth daily. 03/31/17   Cheral MarkerBooker, Kimberly R, CNM  famotidine (PEPCID) 20 MG tablet Take 1 tablet (20 mg total) by mouth 2 (two) times daily. 06/05/17   Arial Galligan, PA-C  flintstones complete (FLINTSTONES) 60 MG chewable tablet Chew 2 tablets by mouth daily.     [provider]  ibuprofen (ADVIL,MOTRIN) 600 MG tablet Take 1 tablet (600 mg total) by mouth every 6 (six) hours. Patient not taking: Reported on 03/21/2017 03/06/17   Allie Bossierove, Myra C, MD  predniSONE (DELTASONE) 10 MG tablet Take 4 tablets (40 mg total) by mouth daily. 06/05/17 06/09/17  Dietrich Pates, PA-C    Family History Family History  Problem Relation Age of Onset  . Hypertension Paternal Grandmother   . Bipolar disorder Paternal Grandmother   . Depression Maternal Grandmother   . Stroke Maternal Grandfather        X 5  . Bipolar disorder Father   . ADD / ADHD Father   . Hypertension Father   . Drug abuse Father        drug overdose  . Depression Father   . Depression Mother   . ADD / ADHD Brother   . Bipolar disorder Sister   . Depression Sister   . ADD / ADHD Sister   . Other Sister        heart condition; had open heart surgery    Social History Social History  Substance Use Topics  . Smoking status: Current Every Day Smoker    Years: 3.00    Types: Cigarettes  . Smokeless tobacco: Never Used     Comment: smokes 1 cig  daily  . Alcohol use No     Allergies   Patient has no known allergies.   Review of Systems Review of Systems  Constitutional: Negative for chills and fever.  HENT: Positive for congestion and facial swelling. Negative for mouth sores, sinus pain, sinus pressure, sore throat and trouble swallowing.   Respiratory: Negative for choking and shortness of breath.   Skin: Positive for rash.     Physical Exam Updated Vital Signs BP (!) 106/58 (BP Location: Left Arm)   Pulse 73   Temp 98 F (36.7 C) (Oral)   Resp 18   Ht  (1.702 m)   Wt 62.1 kg (137 lb)   LMP 05/24/2017   SpO2 97%   BMI 21.46 kg/m   Physical Exam  Constitutional: She appears well-developed and well-nourished. No distress.  Nontoxic appearing and in no acute distress. No signs of angioedema or airway compromise.  HENT:  Head: Normocephalic and atraumatic.  Eyes: Conjunctivae and EOM are normal. No scleral icterus.  Neck: Normal range of motion.  Pulmonary/Chest: Effort normal. No respiratory distress.  Neurological: She is alert.  Skin: Rash noted. She is not diaphoretic. No erythema. No pallor.  Several erythematous, urticarial lesions noted on left side of face, bilateral upper extremities, several lesions scattered on the lower extremity on left side.  Psychiatric: She has a normal mood and affect.  Nursing note and vitals reviewed.    ED Treatments / Results  Labs (all labs ordered are listed, but only abnormal results are displayed) Labs Reviewed - No data to display  EKG  EKG Interpretation None       Radiology No results found.  Procedures Procedures (including critical care time)  Medications Ordered in ED Medications  methylPREDNISolone sodium succinate (SOLU-MEDROL) 125 mg/2 mL injection 125 mg (not administered)     Initial Impression / Assessment and Plan / ED Course  I have reviewed the triage vital signs and the nursing notes.  Pertinent labs & imaging results that  were available during my care of the patient were reviewed by me and considered in my medical decision making (see chart for details).     Patient presents to ED for evaluation of a rash for the past day. Had similar symptoms last week on abdomen. Evaluated by urgent care was unable to get prescriptions filled. Woke today with hives on face, upper and lower extremities. Tried Benadryl with some relief. States that the lesions are itchy.  On my physical exam there are several erythematous, urticarial lesions on bilateral upper extremities and left lower extremity. There are also some present in the face. Pt has a patent airway without stridor and is handling secretions without difficulty; no angioedema. No blisters, no pustules, no warmth, no draining sinus tracts, no superficial abscesses, no bullous impetigo, no vesicles, no desquamation, no target lesions with dusky purpura or a central bulla. Not tender to touch. No concern for superimposed infection. No concern for SJS, TEN, TSS, tick borne illness, syphilis or other life-threatening condition.  This could be a contact dermatitis or allergic reaction to an unknown substance. Patient given Solu-Medrol here in the ED and will discharge with prednisone and Pepcid. Encouraged to continue Benadryl as needed for itching. Patient appears stable for discharge at this time. Strict return precautions given.  Final Clinical Impressions(s) / ED Diagnoses   Final diagnoses:  Rash and nonspecific skin eruption  Urticaria    New Prescriptions New Prescriptions   FAMOTIDINE (PEPCID) 20 MG TABLET    Take 1 tablet (20 mg total) by mouth 2 (two) times daily.   PREDNISONE (DELTASONE) 10 MG TABLET    Take 4 tablets (40 mg total) by mouth daily.     Dietrich Pates, PA-C 06/05/17 1346    Samuel Jester, DO 06/07/17 0740

## 2017-06-05 NOTE — Discharge Instructions (Signed)
Please read attached information regarding your condition. Take prednisone beginning tomorrow for 4 days. Take Pepcid twice daily. Continue initial as needed for itching. Return to ED for worsening symptoms, trouble breathing, lip swelling, fevers or signs of infection.

## 2017-11-30 ENCOUNTER — Ambulatory Visit: Payer: Self-pay | Admitting: Adult Health

## 2018-02-12 IMAGING — US US RENAL
1 series · 15 of 25 positions shown · non-contrast
Comparison: Abdomen and pelvis CT dated 10/30/2013.

CLINICAL DATA: Pyelonephritis. Twenty weeks pregnant. Right flank
pain.

EXAM:
RENAL / URINARY TRACT ULTRASOUND COMPLETE

[Series 1: us renal · 15 of 45 slices shown]
[im 1/45]
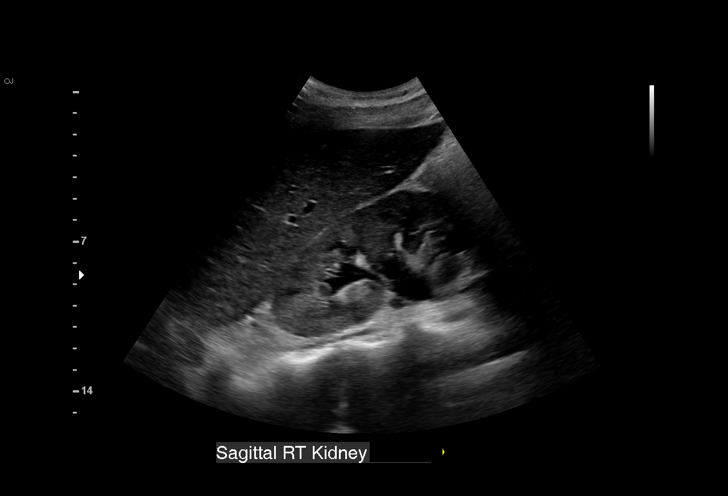
[im 4/45]
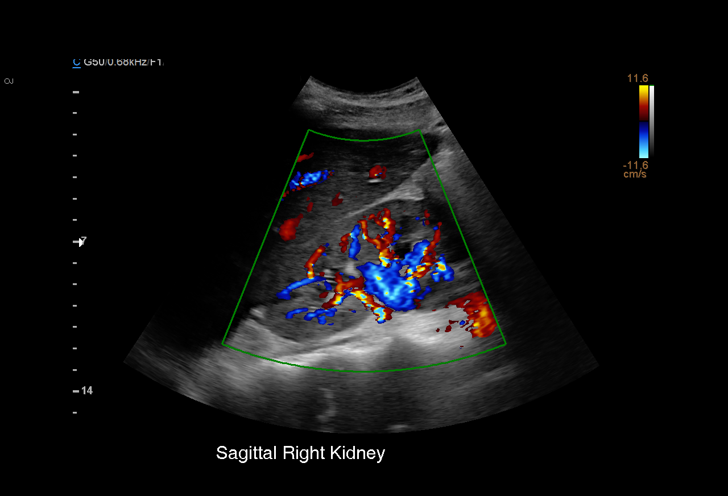
[im 8/45]
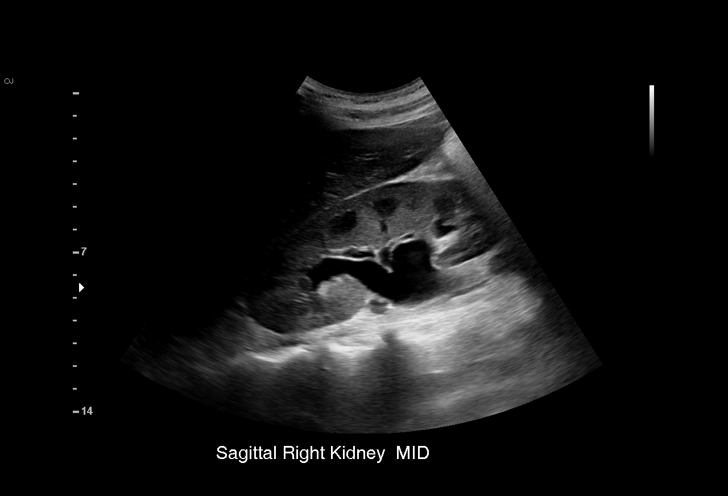
[im 10/45]
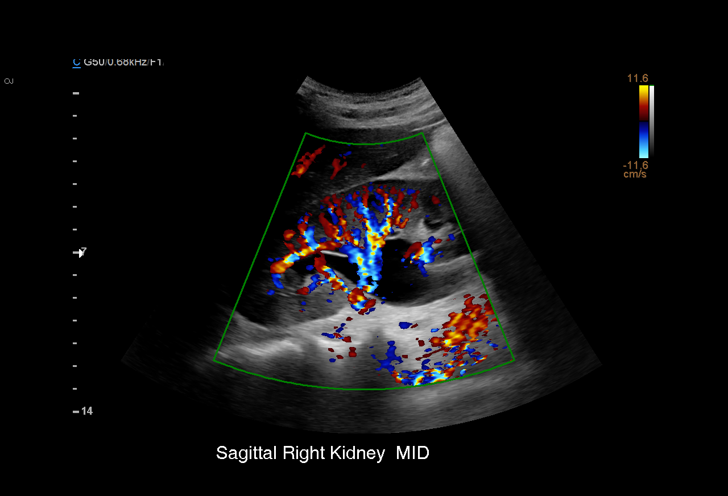
[im 13/45]
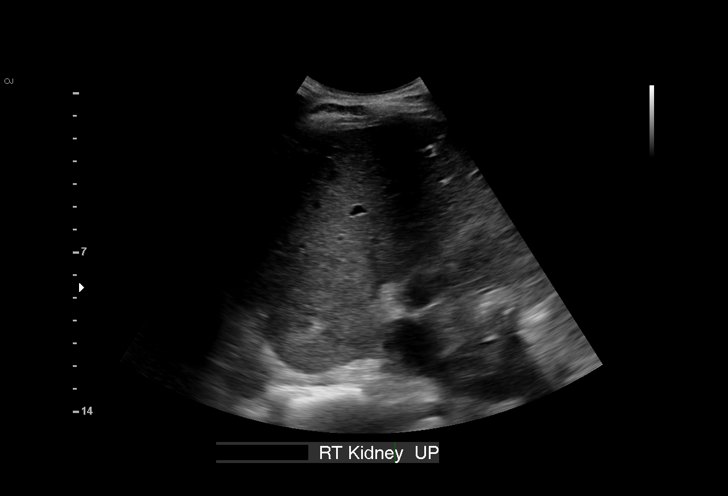
[im 17/45]
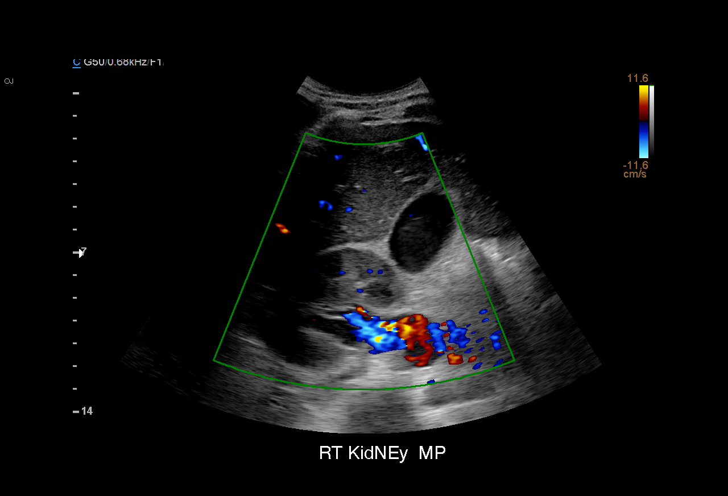
[im 19/45]
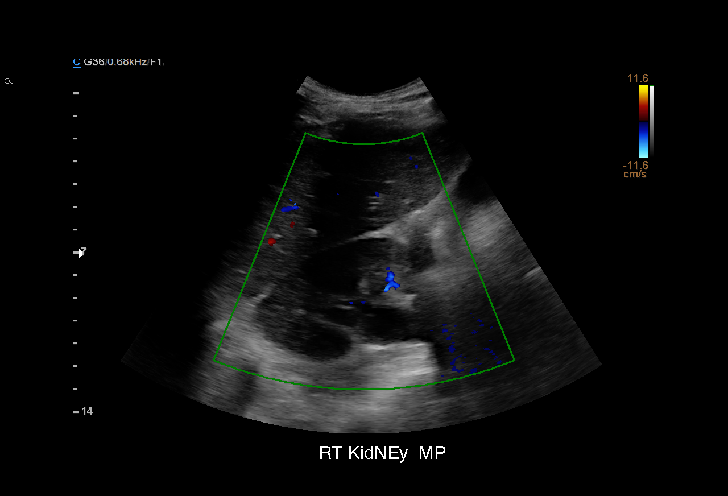
[im 23/45]
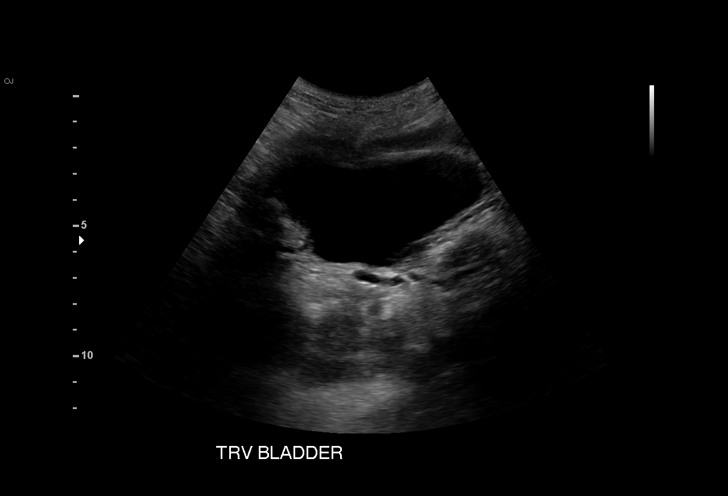
[im 26/45]
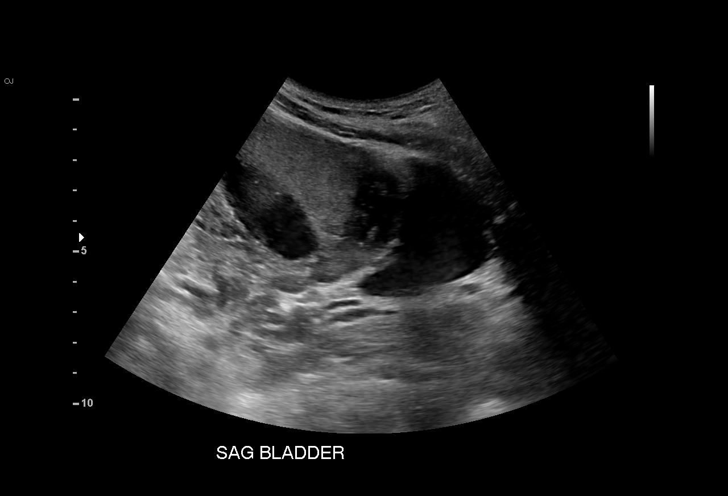
[im 28/45]
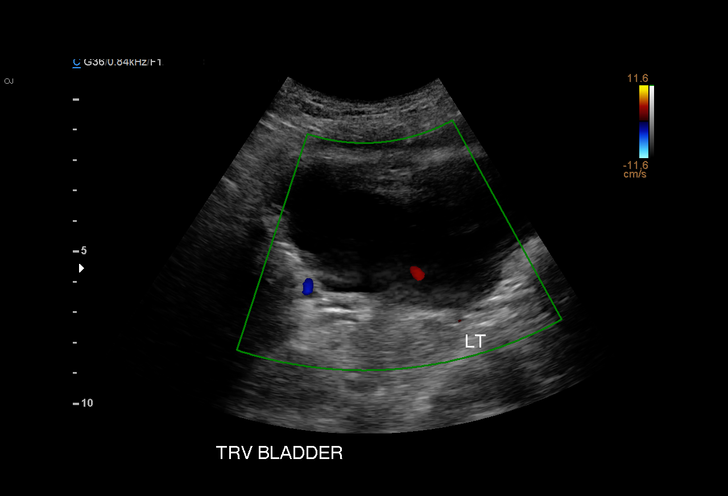
[im 32/45]
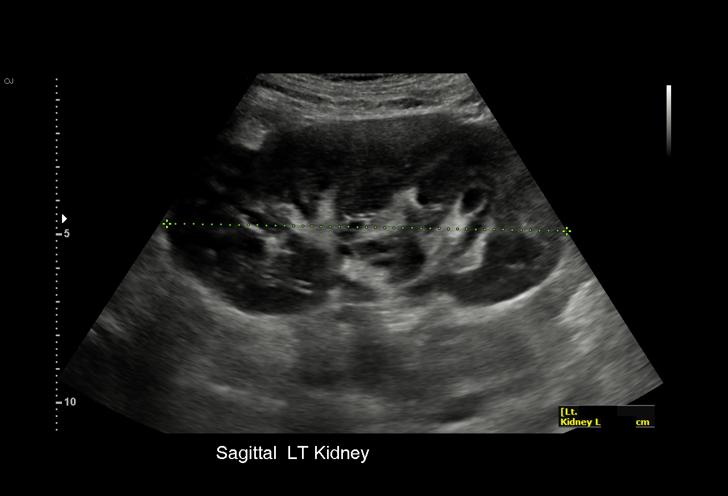
[im 35/45]
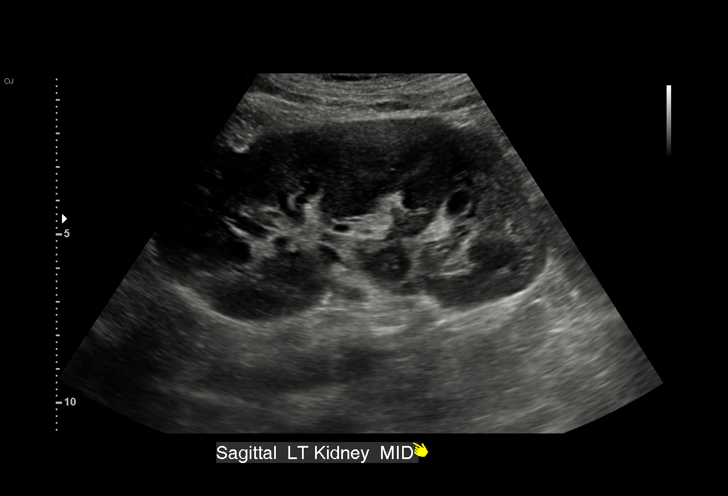
[im 37/45]
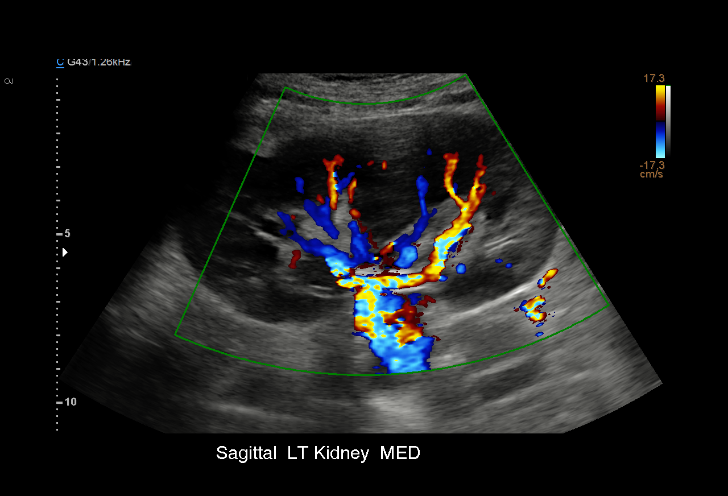
[im 41/45]
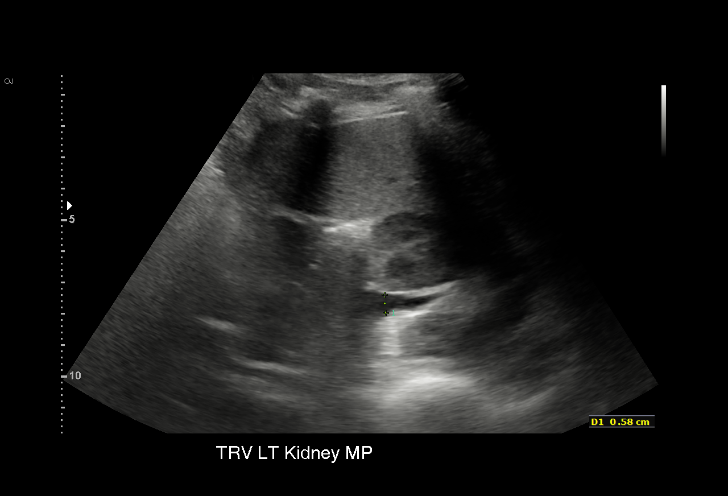
[im 45/45]
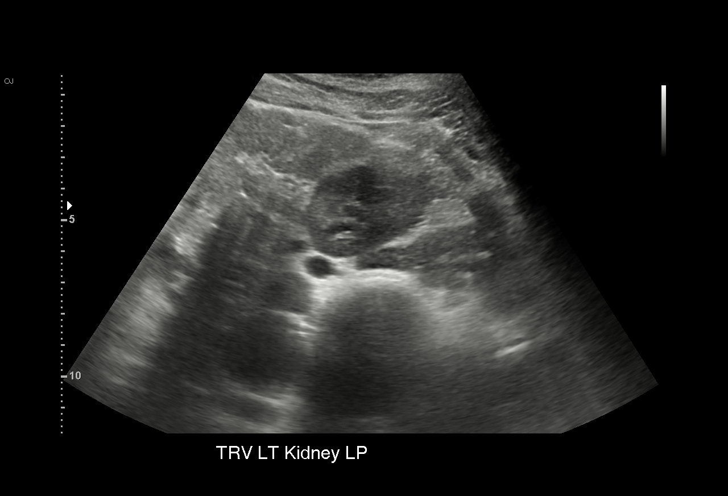

[15 of 25 positions shown; findings below may reference images not displayed]

FINDINGS: Right Kidney:

Length: 12.8 cm. Mild moderate dilatation of the collecting system.
Normal echogenicity.

Left Kidney:

Length: 11.9 cm. Echogenicity within normal limits. No mass or
hydronephrosis visualized.

Bladder:

Appears normal for degree of bladder distention.
IMPRESSION: Mild to moderate right hydronephrosis.

## 2018-02-13 IMAGING — CR DG CHEST 1V PORT
1 series · 1 of 1 positions shown · non-contrast
Comparison: Portable exam 5009 hours compared to 4040 hours

CLINICAL DATA: Endotracheal and orogastric tube placements

EXAM:
PORTABLE CHEST 1 VIEW

[AP]
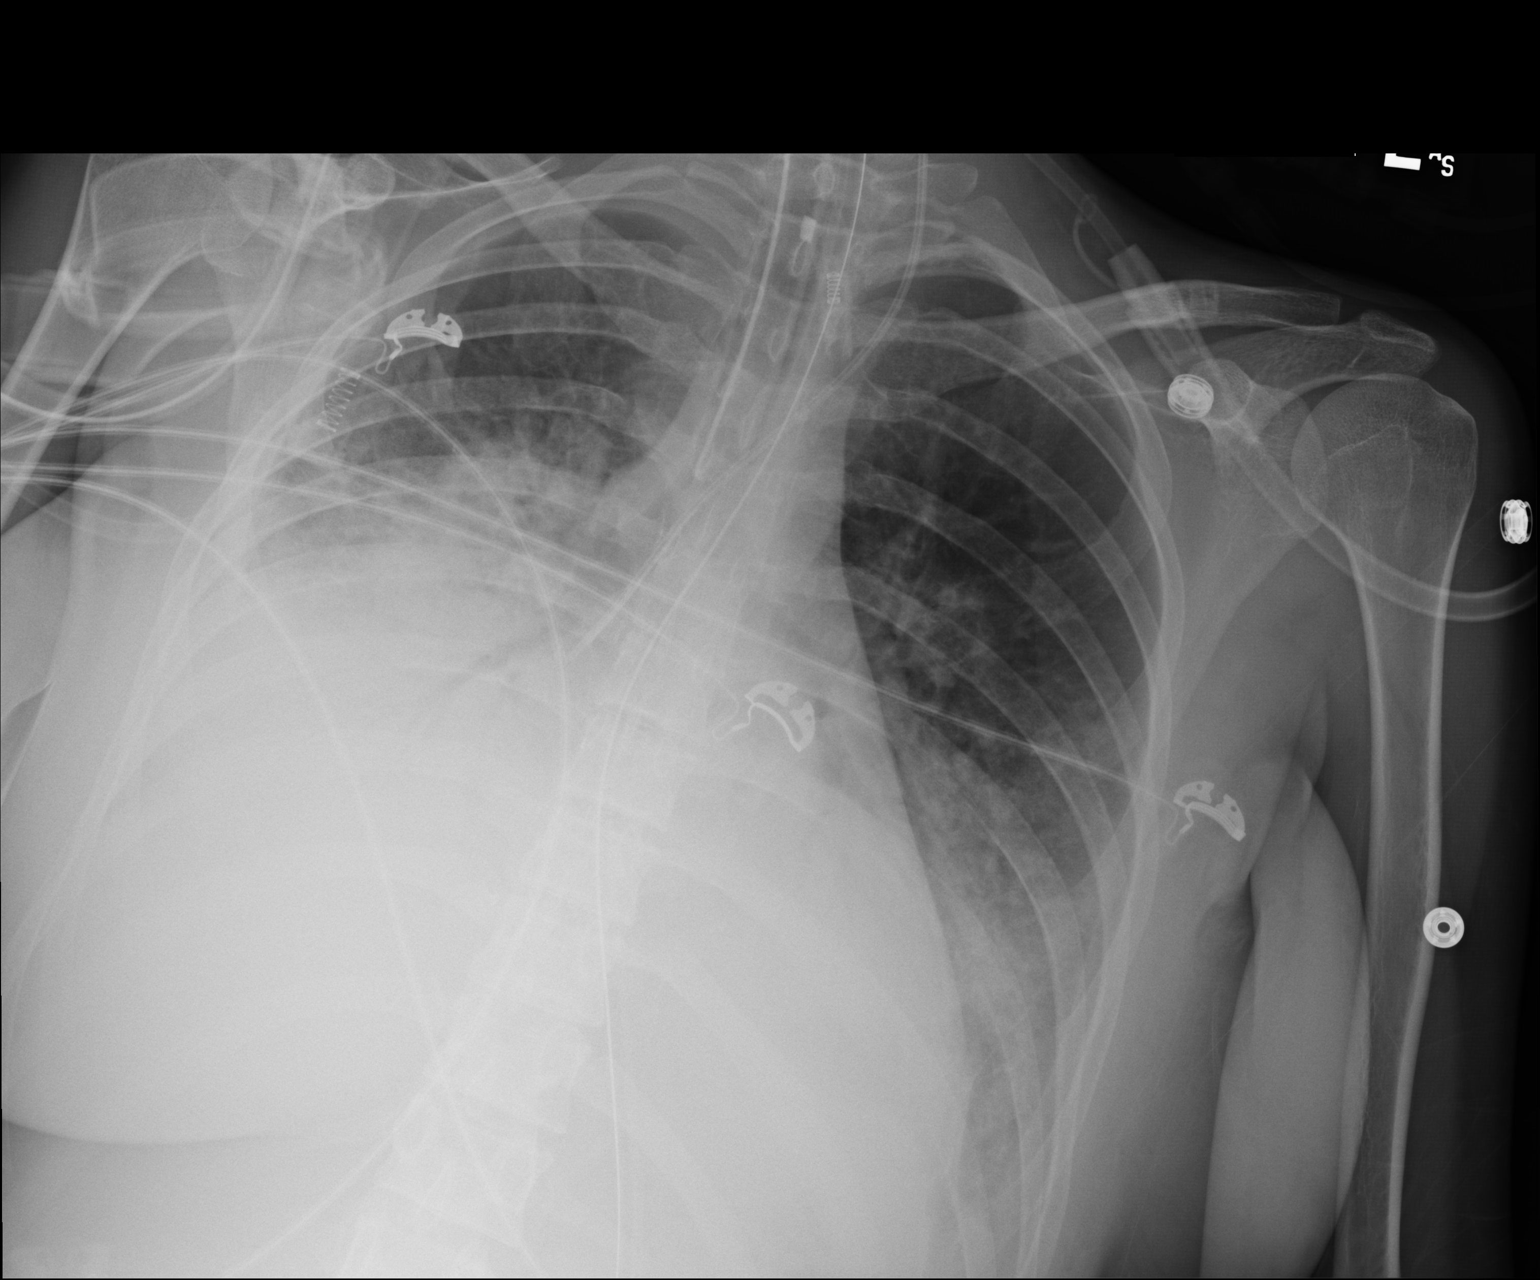

[1 of 1 positions shown; findings below may reference images not displayed]

FINDINGS: Tip of endotracheal tube projects 1.8 cm above carina.

Nasogastric tube extends into stomach.

LEFT jugular central venous catheter tip projects over SVC.

Heart remains mildly enlarged.

Persistent airspace opacities in the mid to lower lungs bilaterally.

Small BILATERAL pleural effusions.

No pneumothorax.
IMPRESSION: Line and tube positions as above.

Persistent significant BILATERAL basilar opacities and small pleural
effusions.

## 2018-02-13 IMAGING — CR DG CHEST 2V
2 series · 2 of 2 positions shown · non-contrast
Comparison: Two-view chest x-ray 06/08/2013

CLINICAL DATA: Twenty weeks pregnant. Second trimester pregnancy.
Cough. Shortness breath and fever. Right lower lobe pneumonia.

EXAM:
CHEST  2 VIEW

[chest pa]
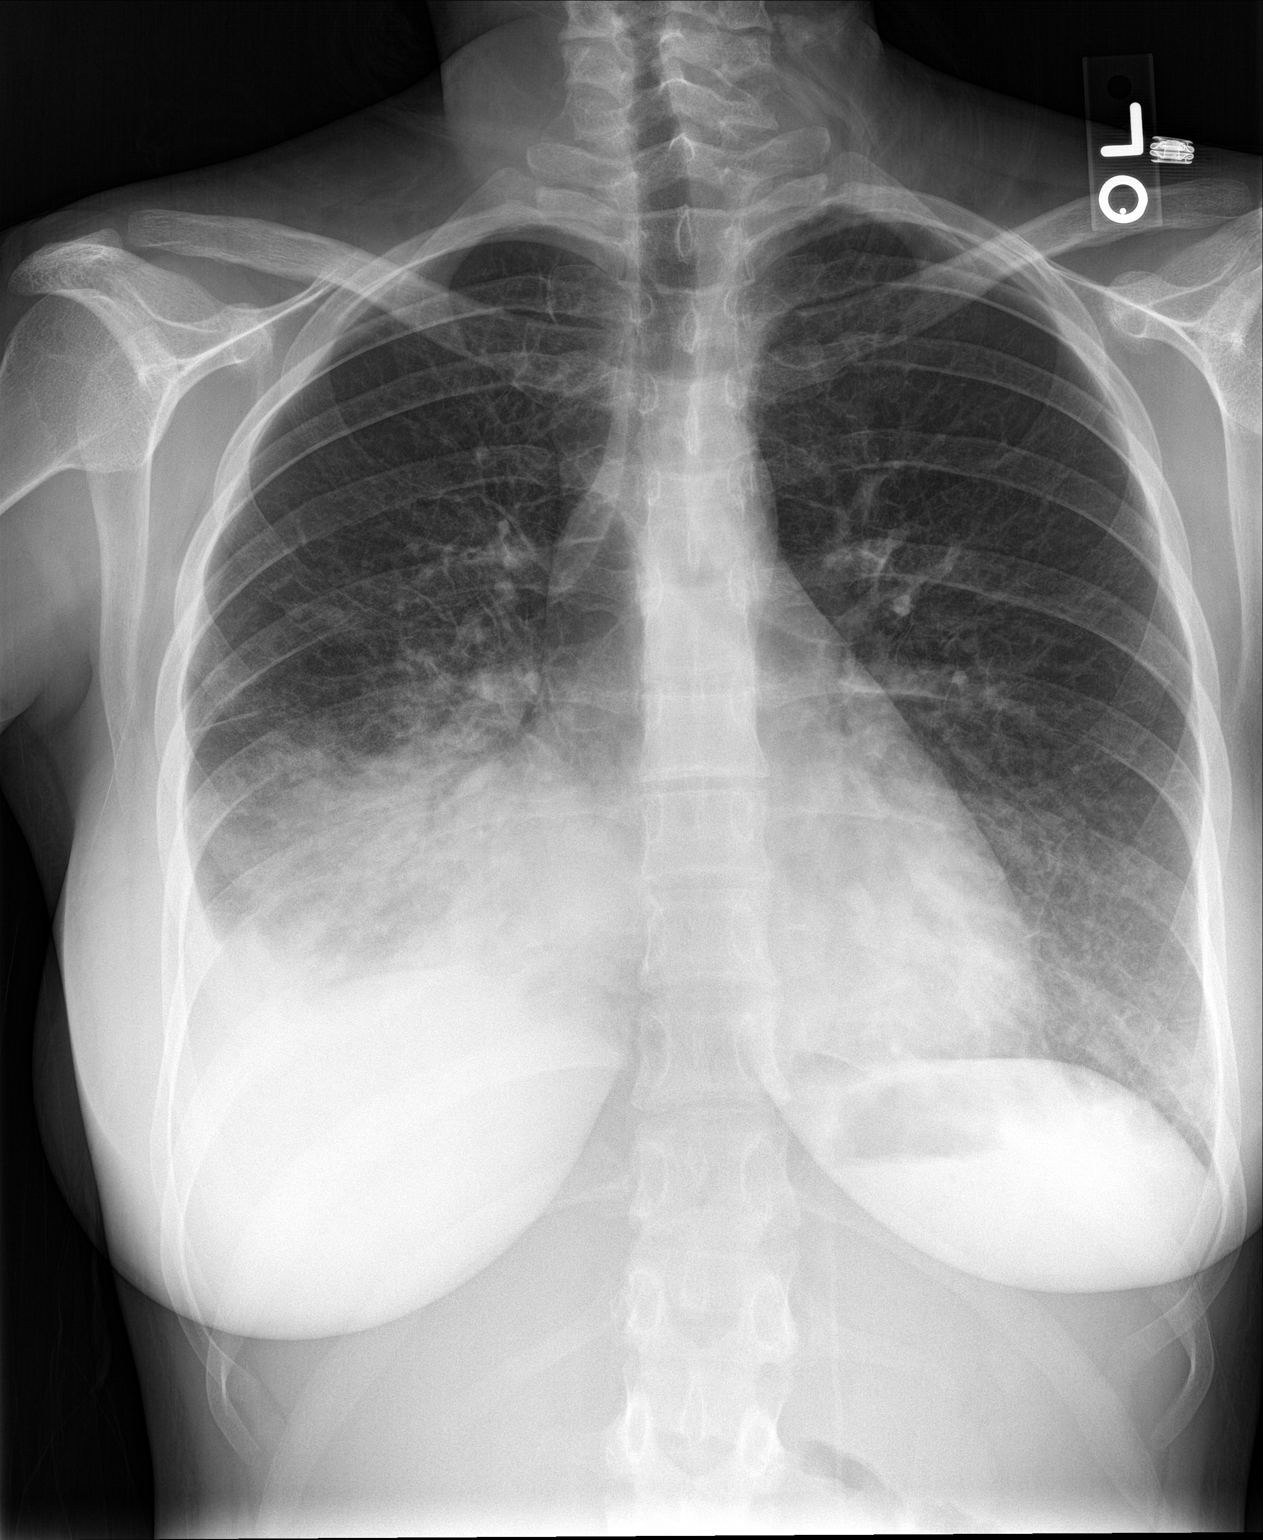

[chest lat]
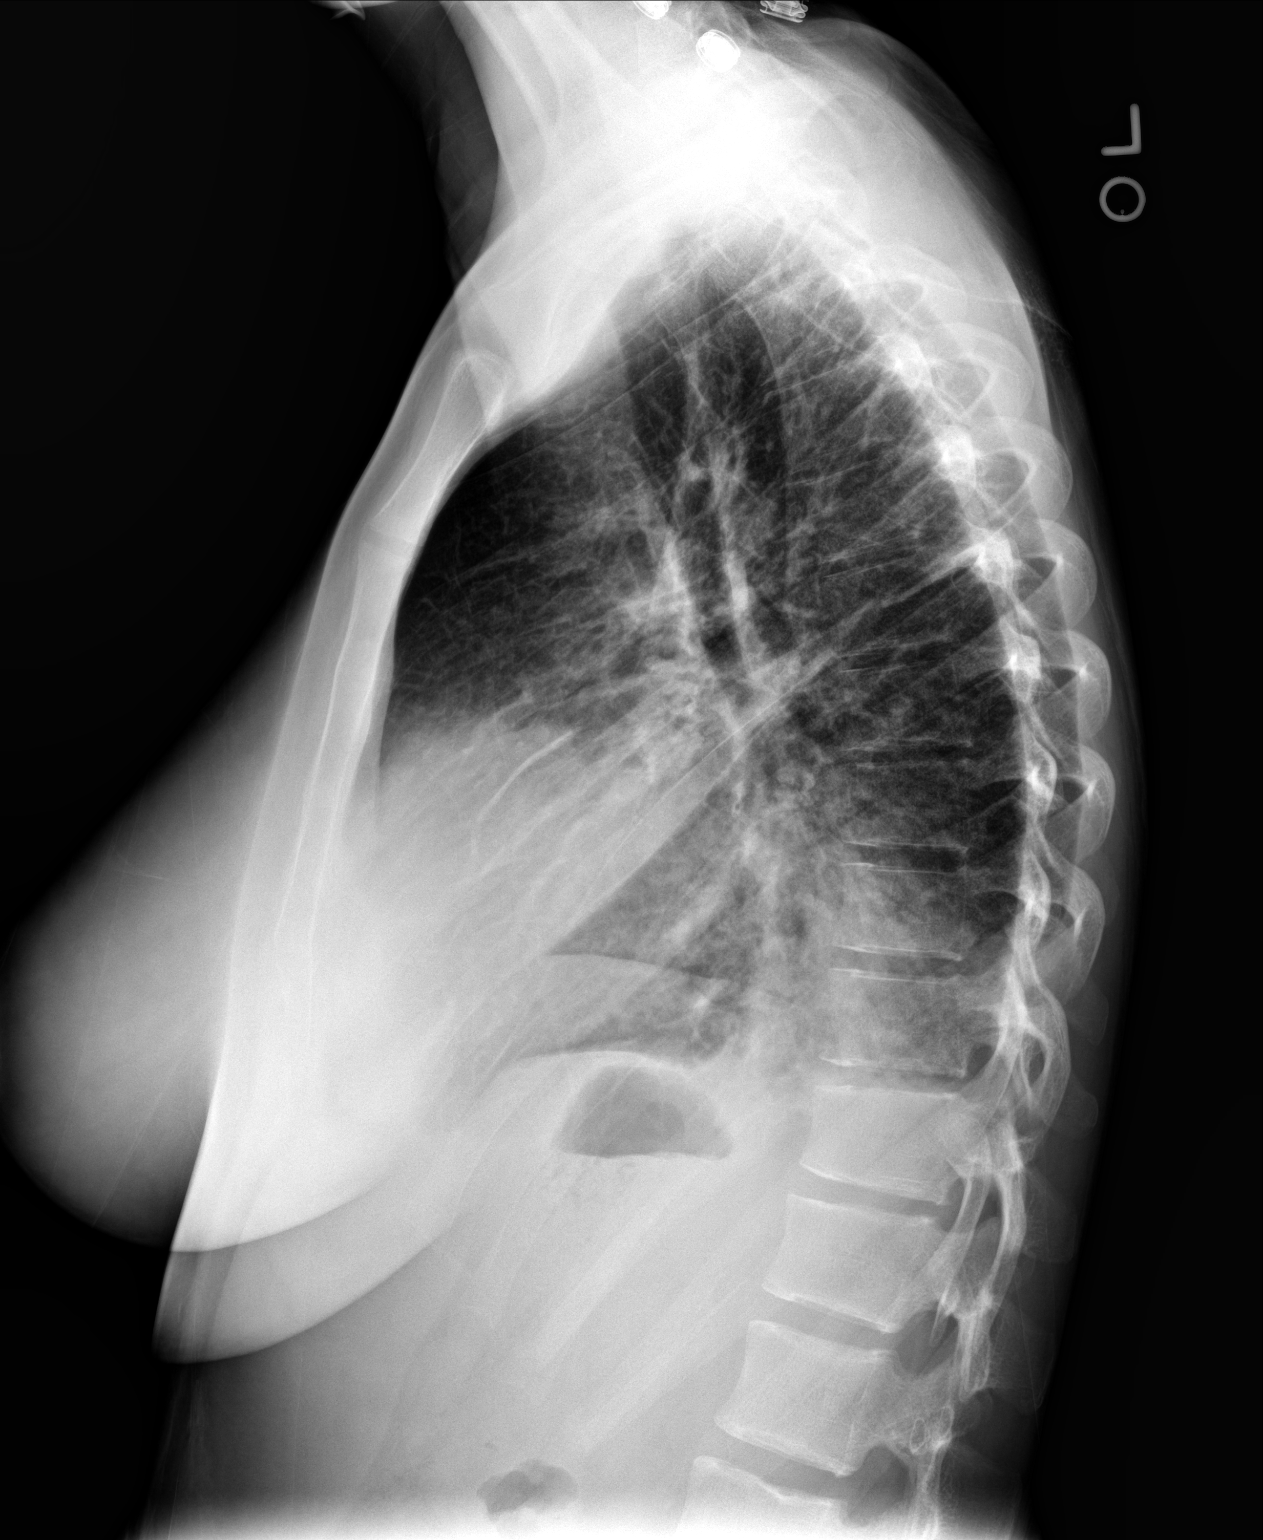

[2 of 2 positions shown; findings below may reference images not displayed]

FINDINGS: The heart size is normal. Right lower and middle lobe pneumonia is
present. Right pleural effusion is suggested. There is mild
dependent atelectasis on the left. The upper lung fields are clear.
The visualized soft tissues and bony thorax are unremarkable.
IMPRESSION: 1. Right lower lobe and probable full middle lobe pneumonia.
2. Right pleural effusion.
3. Mild left basilar atelectasis.

## 2018-02-14 IMAGING — CT CT ABD-PEL WO/W CM
2 of 4 series · 13 of 46 positions shown, 15 images · IV contrast (agent unspecified)
Comparison: Renal ultrasound - 10/07/2016; CT abdomen pelvis -
10/30/2013

CLINICAL DATA: Concern for obstructive pyelonephritis now with
multifocal pneumonia and ARDS. Please evaluate for nephrolithiasis
and/or urinary obstruction.

EXAM:
CT ABDOMEN AND PELVIS WITHOUT AND WITH CONTRAST
TECHNIQUE: Multidetector CT imaging of the abdomen and pelvis was performed
following the standard protocol before and following the bolus
administration of intravenous contrast. For the purposes of limiting
fetal radiation exposure, postcontrast images were only acquired
through the level of the bilateral kidneys.
CONTRAST:  90 cc [id number]

[Series 3: abd/ pelvis 5.0 i30f 1 · axial · 0.64mm/px · z∈[+1037,+1457]mm · 10 of 100 slices shown, 12 images]
[im 8/100  soft-tissue]
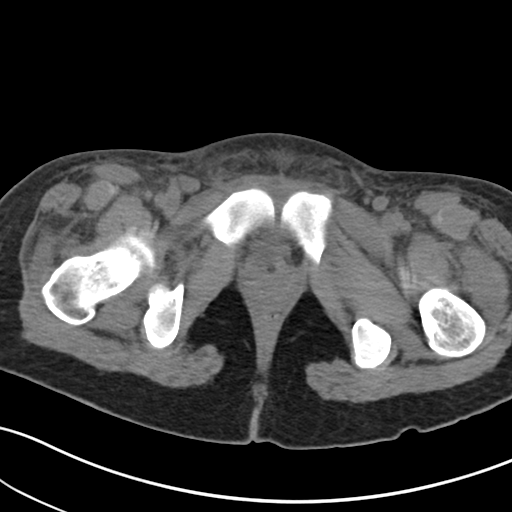
[im 8/100  bone]
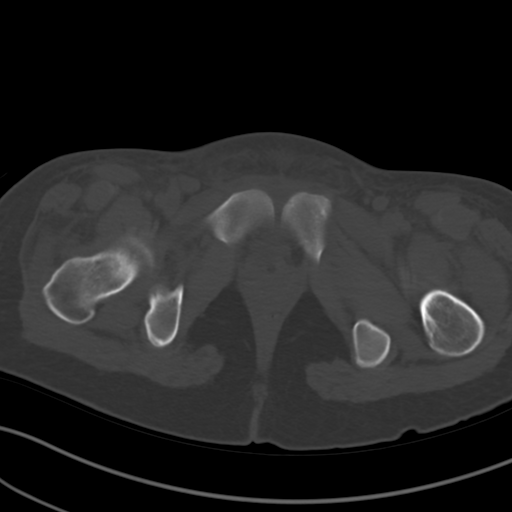
[im 16/100  soft-tissue]
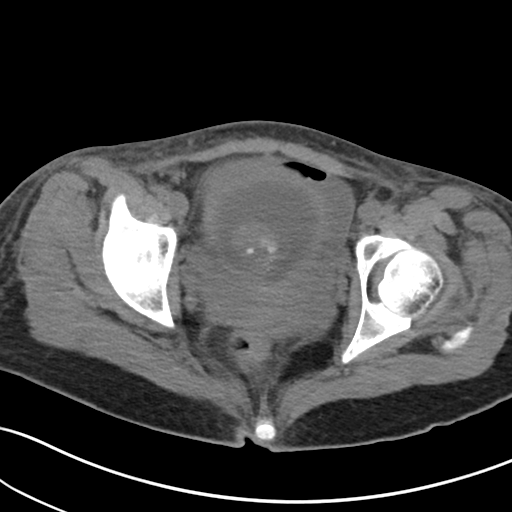
[im 28/100  soft-tissue]
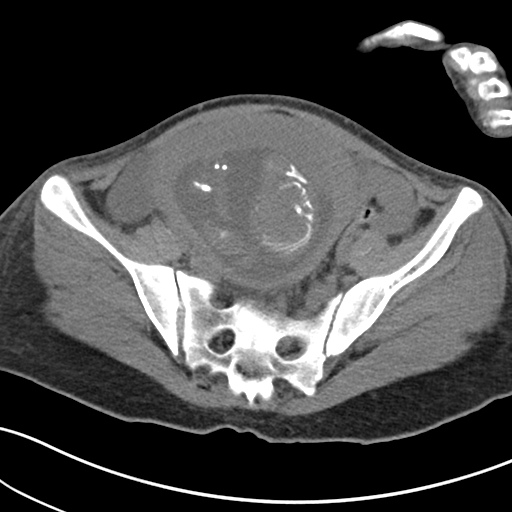
[im 36/100  soft-tissue]
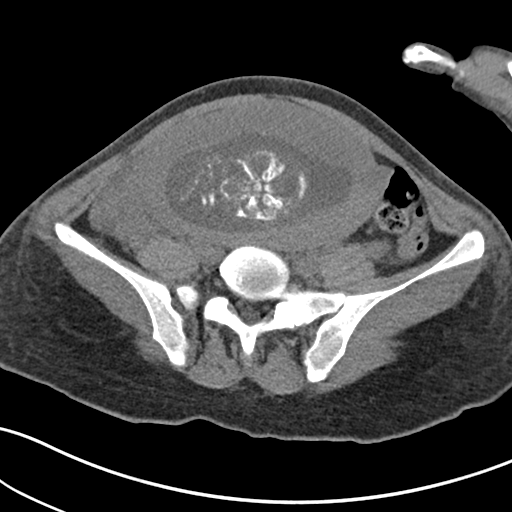
[im 44/100  soft-tissue]
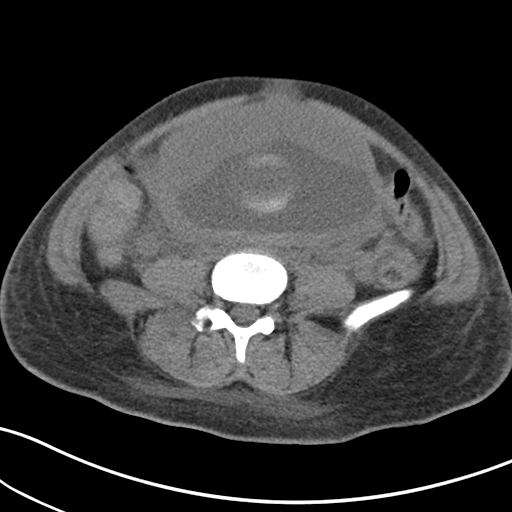
[im 56/100  soft-tissue]
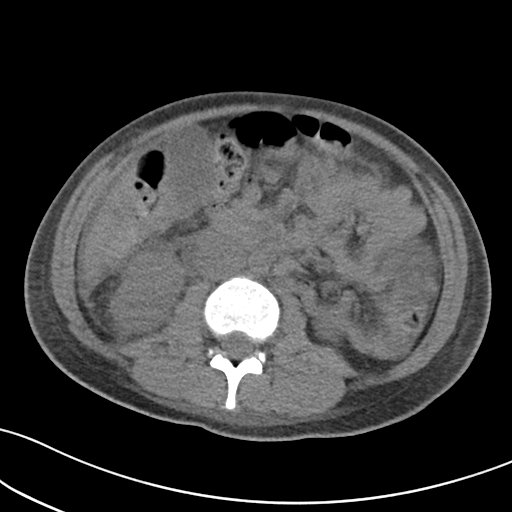
[im 64/100  soft-tissue]
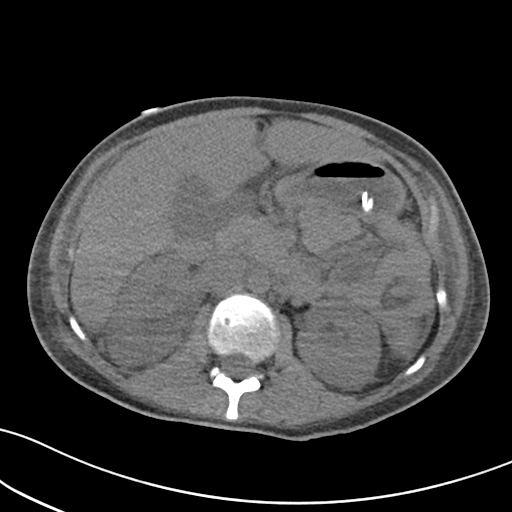
[im 76/100  soft-tissue]
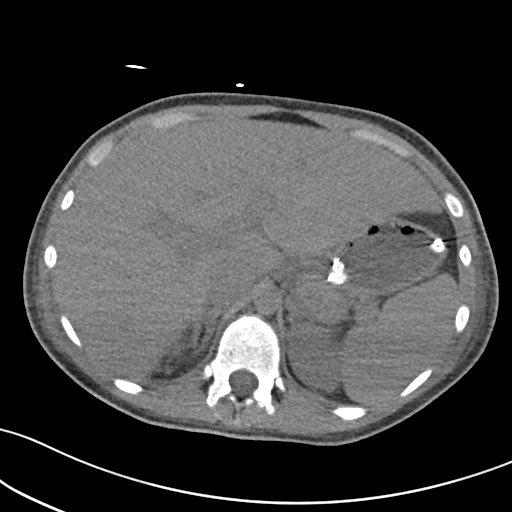
[im 84/100  soft-tissue]
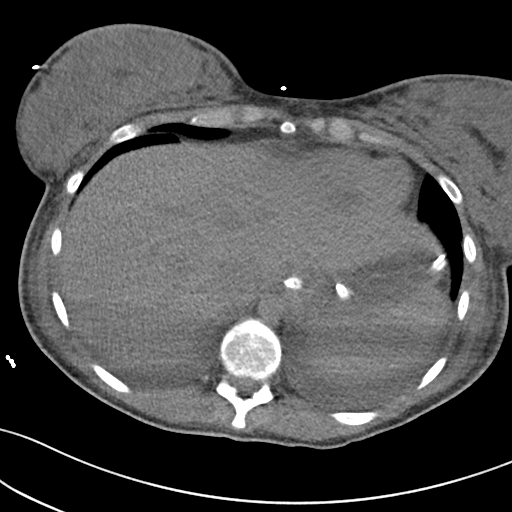
[im 84/100  bone]
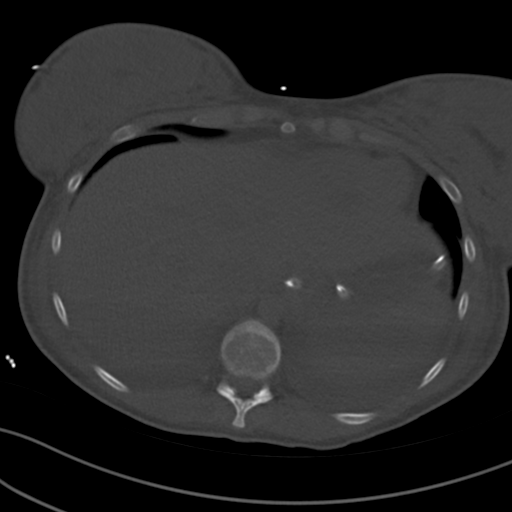
[im 92/100  soft-tissue]
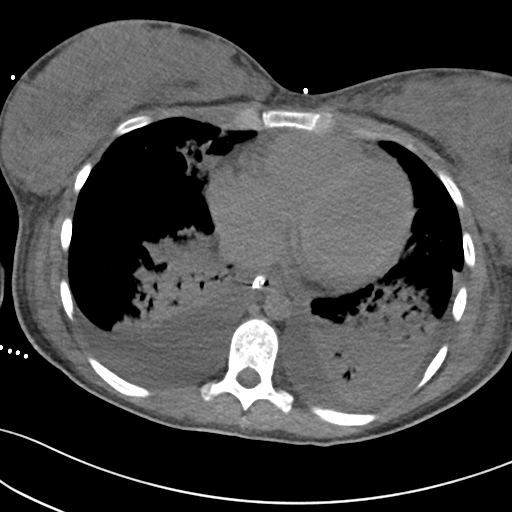

[Series 6: cor st · coronal · 0.59mm/px · 3 of 72 slices shown]
[im 24/72  soft-tissue]
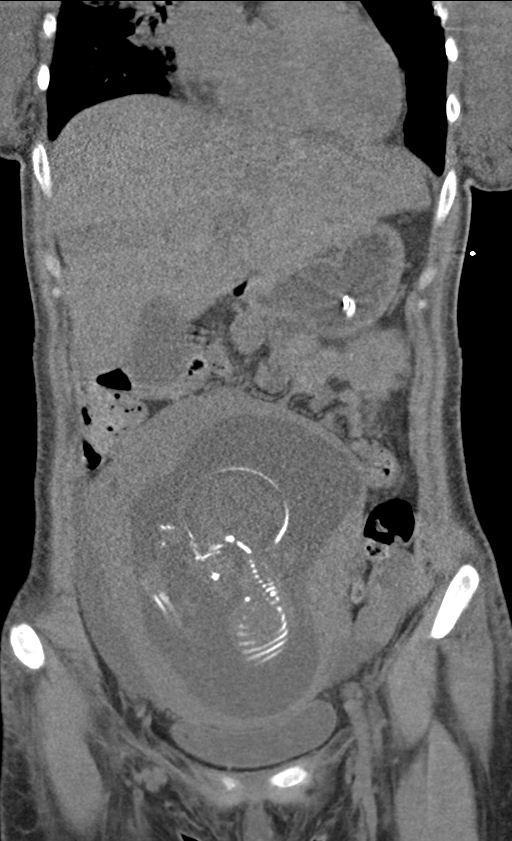
[im 32/72  soft-tissue]
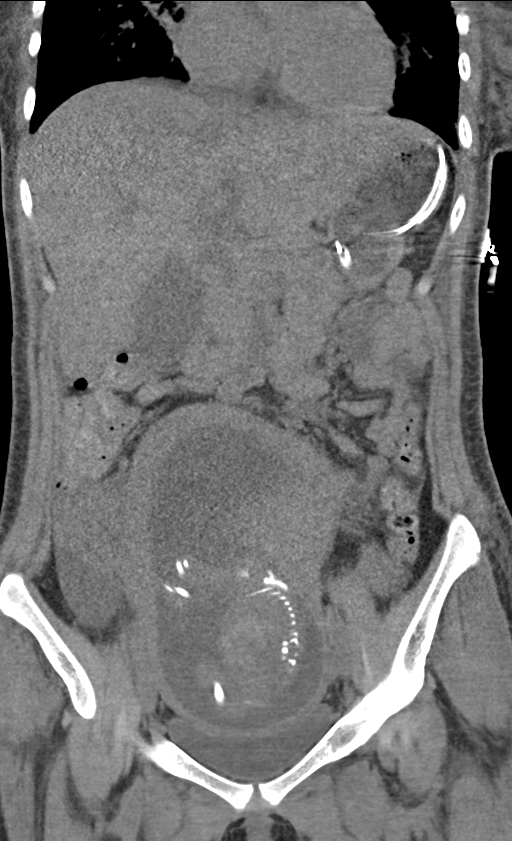
[im 40/72  soft-tissue]
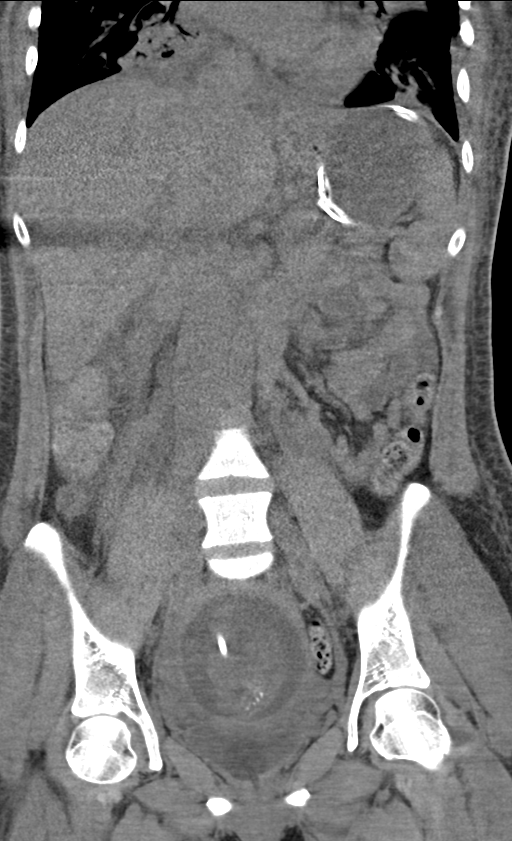

[13 of 46 positions shown; findings below may reference images not displayed]

FINDINGS: Lower chest: Limited visualization of the lower thorax demonstrates
small/trace bilateral effusions, right greater than left and rather
extensive consolidative airspace opacities with associated air
bronchograms within the imaged bilateral lower lobes in caudal
aspects of the right middle lobe and lingula.

Normal heart size.  No pericardial effusion.

Hepatobiliary: Normal hepatic contour. No discrete hepatic lesions.
Normal appearance of the gallbladder given degree distention. Trace
amount of fluid about the caudal aspect the right lobe of the liver.

Pancreas: Normal appearance of the pancreas

Spleen: Normal appearance of the spleen

Adrenals/Urinary Tract: There is symmetric enhancement and excretion
of the bilateral kidneys. Mild right-sided pelvicaliectasis and
ureterectasis secondary to mass effect from the patient's gravid
uterus. No renal stones. No evidence of left-sided urinary
obstruction.

Postcontrast imaging demonstrates mottled appearance of the right
kidney with a striated nephrogram compatible with provided history
of pyelonephritis. There are multiple ill-defined the geographic
areas of hypoenhancement scattered throughout the right kidney with
dominant component within the superior pole of the right kidney
measuring 3.0 x 2.3 cm (image 9, series 2), additional area within
the posterosuperior aspect of the right kidney measuring
approximately 2.0 x 1.9 cm (image 16, series 2), dominant area
within the interpolar region measuring approximately 3.2 x 2.3 cm
(image 20, series 2) and dominant component within the anterior
inferior aspect of the right kidney measuring approximately 3.1 x
1.9 cm (image 27, series 2), findings worrisome for developing
multifocal renal abscesses. None of these ill-defined areas of the
right kidney demonstrate peripheral wall enhancement. No significant
asymmetric perinephric stranding.

No discrete left-sided renal lesions.

Stomach/Bowel: Enteric tube tip terminates within the mid body of
the stomach.

Moderate colonic stool burden without evidence of enteric
obstruction. No pneumoperitoneum, pneumatosis or portal venous gas.

Vascular/Lymphatic: Normal caliber the abdominal aorta. No bulky
retroperitoneal, mesenteric, pelvic or inguinal lymphadenopathy.

Reproductive: Gravid uterus. No discrete adnexal lesion. Small
amount of free fluid seen within the pelvic cul-de-sac and right
lower abdominal quadrant/ pelvis.

Other: Mild diffuse body wall anasarca.

Musculoskeletal: No acute or aggressive osseous abnormalities.
IMPRESSION: 1. Unchanged mild right-sided pelvicaliectasis and ureterectasis
without evidence of nephrolithiasis, asymmetrically delayed renal
enhancement or urinary excretion. These findings are again favored
to be physiologic given patient's gravid state.
2. Findings compatible with extensive right-sided pyelonephritis
with multiple ill-defined areas of hypoenhancement worrisome for
developing multifocal abscess formation. None of the ill-defined
areas of hypoenhancement within the right kidney currently
demonstrate peripheral wall enhancement to suggest well-defined
abscess formation therefore it is unlikely the lesions are currently
amenable to percutaneous drainage catheter placement. Continued
attention on follow-up could be performed as indicated, however
would limit any subsequent CT scans to postcontrast imaging through
the kidneys to limit fetal radiation exposure.
3. Extensive airspace opacities with associated air bronchograms
within the imaged caudal aspects of the bilateral lower lungs
worrisome for multifocal infection.
4. Small/trace bilateral effusions, right greater than left.
Above findings discussed with Dr.Idrissa (CCM) and Kumberger
(Urology) at the time of examination completion.

## 2018-05-13 ENCOUNTER — Emergency Department (HOSPITAL_COMMUNITY): Payer: Medicaid Other

## 2018-05-13 ENCOUNTER — Other Ambulatory Visit: Payer: Self-pay

## 2018-05-13 ENCOUNTER — Encounter (HOSPITAL_COMMUNITY): Payer: Self-pay | Admitting: *Deleted

## 2018-05-13 ENCOUNTER — Emergency Department (HOSPITAL_COMMUNITY)
Admission: EM | Admit: 2018-05-13 | Discharge: 2018-05-13 | Disposition: A | Discharge status: Home / Self Care | Payer: Medicaid Other | Attending: Emergency Medicine | Admitting: Emergency Medicine

## 2018-05-13 DIAGNOSIS — F17210 Nicotine dependence, cigarettes, uncomplicated: Secondary | ICD-10-CM | POA: Diagnosis not present

## 2018-05-13 DIAGNOSIS — F1910 Other psychoactive substance abuse, uncomplicated: Secondary | ICD-10-CM | POA: Insufficient documentation

## 2018-05-13 DIAGNOSIS — R569 Unspecified convulsions: Secondary | ICD-10-CM | POA: Diagnosis present

## 2018-05-13 DIAGNOSIS — Z79899 Other long term (current) drug therapy: Secondary | ICD-10-CM | POA: Insufficient documentation

## 2018-05-13 HISTORY — DX: Post-traumatic stress disorder, unspecified: F43.10

## 2018-05-13 LAB — URINALYSIS, ROUTINE W REFLEX MICROSCOPIC
BILIRUBIN URINE: NEGATIVE
Glucose, UA: NEGATIVE mg/dL
KETONES UR: NEGATIVE mg/dL
Nitrite: NEGATIVE
PH: 6 (ref 5.0–8.0)
Protein, ur: 30 mg/dL — AB
Specific Gravity, Urine: 1.024 (ref 1.005–1.030)

## 2018-05-13 LAB — CBC WITH DIFFERENTIAL/PLATELET
BASOS PCT: 1 %
Basophils Absolute: 0 10*3/uL (ref 0.0–0.1)
EOS ABS: 0.2 10*3/uL (ref 0.0–0.7)
EOS PCT: 3 %
HCT: 43.2 % (ref 36.0–46.0)
Hemoglobin: 14.2 g/dL (ref 12.0–15.0)
Lymphocytes Relative: 41 %
Lymphs Abs: 2.5 10*3/uL (ref 0.7–4.0)
MCH: 30.2 pg (ref 26.0–34.0)
MCHC: 32.9 g/dL (ref 30.0–36.0)
MCV: 91.9 fL (ref 78.0–100.0)
MONO ABS: 0.3 10*3/uL (ref 0.1–1.0)
Monocytes Relative: 5 %
Neutro Abs: 3.1 10*3/uL (ref 1.7–7.7)
Neutrophils Relative %: 52 %
PLATELETS: 205 10*3/uL (ref 150–400)
RBC: 4.7 MIL/uL (ref 3.87–5.11)
RDW: 12.8 % (ref 11.5–15.5)
WBC: 6.1 10*3/uL (ref 4.0–10.5)

## 2018-05-13 LAB — ACETAMINOPHEN LEVEL

## 2018-05-13 LAB — BASIC METABOLIC PANEL
Anion gap: 5 (ref 5–15)
BUN: 10 mg/dL (ref 6–20)
CALCIUM: 9.1 mg/dL (ref 8.9–10.3)
CO2: 30 mmol/L (ref 22–32)
CREATININE: 1.02 mg/dL — AB (ref 0.44–1.00)
Chloride: 105 mmol/L (ref 98–111)
GFR calc Af Amer: >60 mL/min (ref >60)
GLUCOSE: 75 mg/dL (ref 70–99)
POTASSIUM: 3.7 mmol/L (ref 3.5–5.1)
SODIUM: 140 mmol/L (ref 135–145)

## 2018-05-13 LAB — PHOSPHORUS: Phosphorus: 3.7 mg/dL (ref 2.5–4.6)

## 2018-05-13 LAB — RAPID URINE DRUG SCREEN, HOSP PERFORMED
Amphetamines: POSITIVE — AB
BENZODIAZEPINES: POSITIVE — AB
Barbiturates: NOT DETECTED
COCAINE: POSITIVE — AB
Opiates: POSITIVE — AB
Tetrahydrocannabinol: POSITIVE — AB

## 2018-05-13 LAB — SALICYLATE LEVEL

## 2018-05-13 LAB — MAGNESIUM: Magnesium: 2.2 mg/dL (ref 1.7–2.4)

## 2018-05-13 LAB — I-STAT BETA HCG BLOOD, ED (MC, WL, AP ONLY)

## 2018-05-13 LAB — CK: CK TOTAL: 65 U/L (ref 38–234)

## 2018-05-13 LAB — ETHANOL

## 2018-05-13 LAB — CBG MONITORING, ED: GLUCOSE-CAPILLARY: 113 mg/dL — AB (ref 70–99)

## 2018-05-13 MED ORDER — SODIUM CHLORIDE 0.9 % IV BOLUS
1000.0000 mL | Freq: Once | INTRAVENOUS | Status: AC
  Administered 2018-05-13: 1000 mL via INTRAVENOUS

## 2018-05-13 MED ORDER — SODIUM CHLORIDE 0.9 % IV SOLN: INTRAVENOUS | Status: DC

## 2018-05-13 NOTE — ED Triage Notes (Signed)
Pt called for triage. Pt outside smoking and requests a few minutes prior to coming to triage.
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
## 2018-05-13 NOTE — Discharge Instructions (Signed)
Do not use cocaine, amphetamines, marijuana, benzodiazepines or opiates.
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
## 2018-08-12 ENCOUNTER — Emergency Department (HOSPITAL_COMMUNITY)
Admission: EM | Admit: 2018-08-12 | Discharge: 2018-08-12 | Disposition: A | Discharge status: Home / Self Care | Payer: Medicaid Other | Attending: Emergency Medicine | Admitting: Emergency Medicine

## 2018-08-12 ENCOUNTER — Encounter (HOSPITAL_COMMUNITY): Payer: Self-pay

## 2018-08-12 ENCOUNTER — Other Ambulatory Visit: Payer: Self-pay

## 2018-08-12 DIAGNOSIS — R21 Rash and other nonspecific skin eruption: Secondary | ICD-10-CM | POA: Diagnosis not present

## 2018-08-12 DIAGNOSIS — R42 Dizziness and giddiness: Secondary | ICD-10-CM | POA: Diagnosis not present

## 2018-08-12 DIAGNOSIS — Z79899 Other long term (current) drug therapy: Secondary | ICD-10-CM | POA: Insufficient documentation

## 2018-08-12 DIAGNOSIS — F909 Attention-deficit hyperactivity disorder, unspecified type: Secondary | ICD-10-CM | POA: Diagnosis not present

## 2018-08-12 LAB — CBC WITH DIFFERENTIAL/PLATELET
Abs Immature Granulocytes: 0.02 10*3/uL (ref 0.00–0.07)
Basophils Absolute: 0.1 10*3/uL (ref 0.0–0.1)
Basophils Relative: 1 %
Eosinophils Absolute: 0.2 10*3/uL (ref 0.0–0.5)
Eosinophils Relative: 2 %
HCT: 40.5 % (ref 36.0–46.0)
Hemoglobin: 12.8 g/dL (ref 12.0–15.0)
IMMATURE GRANULOCYTES: 0 %
Lymphocytes Relative: 32 %
Lymphs Abs: 2.7 10*3/uL (ref 0.7–4.0)
MCH: 30 pg (ref 26.0–34.0)
MCHC: 31.6 g/dL (ref 30.0–36.0)
MCV: 94.8 fL (ref 80.0–100.0)
MONO ABS: 0.5 10*3/uL (ref 0.1–1.0)
Monocytes Relative: 6 %
NEUTROS ABS: 4.9 10*3/uL (ref 1.7–7.7)
Neutrophils Relative %: 59 %
PLATELETS: 193 10*3/uL (ref 150–400)
RBC: 4.27 MIL/uL (ref 3.87–5.11)
RDW: 12.8 % (ref 11.5–15.5)
WBC: 8.4 10*3/uL (ref 4.0–10.5)
nRBC: 0 % (ref 0.0–0.2)

## 2018-08-12 LAB — URINALYSIS, ROUTINE W REFLEX MICROSCOPIC
Bilirubin Urine: NEGATIVE
GLUCOSE, UA: NEGATIVE mg/dL
Ketones, ur: NEGATIVE mg/dL
Nitrite: NEGATIVE
Protein, ur: NEGATIVE mg/dL
SPECIFIC GRAVITY, URINE: 1.018 (ref 1.005–1.030)
pH: 7 (ref 5.0–8.0)

## 2018-08-12 LAB — RAPID URINE DRUG SCREEN, HOSP PERFORMED
Amphetamines: NOT DETECTED
BENZODIAZEPINES: POSITIVE — AB
Barbiturates: NOT DETECTED
Cocaine: POSITIVE — AB
Opiates: POSITIVE — AB
TETRAHYDROCANNABINOL: POSITIVE — AB

## 2018-08-12 LAB — COMPREHENSIVE METABOLIC PANEL
ALT: 13 U/L (ref 0–44)
ANION GAP: 6 (ref 5–15)
AST: 15 U/L (ref 15–41)
Albumin: 3.2 g/dL — ABNORMAL LOW (ref 3.5–5.0)
Alkaline Phosphatase: 60 U/L (ref 38–126)
BUN: 10 mg/dL (ref 6–20)
CO2: 30 mmol/L (ref 22–32)
Calcium: 8.9 mg/dL (ref 8.9–10.3)
Chloride: 102 mmol/L (ref 98–111)
Creatinine, Ser: 0.66 mg/dL (ref 0.44–1.00)
GFR calc non Af Amer: >60 mL/min (ref >60)
GLUCOSE: 86 mg/dL (ref 70–99)
POTASSIUM: 4.3 mmol/L (ref 3.5–5.1)
SODIUM: 138 mmol/L (ref 135–145)
Total Bilirubin: 0.5 mg/dL (ref 0.3–1.2)
Total Protein: 6.3 g/dL — ABNORMAL LOW (ref 6.5–8.1)

## 2018-08-12 LAB — I-STAT BETA HCG BLOOD, ED (MC, WL, AP ONLY)

## 2018-08-12 LAB — ACETAMINOPHEN LEVEL: Acetaminophen (Tylenol), Serum: <10 ug/mL — ABNORMAL LOW (ref 10–30)

## 2018-08-12 LAB — SALICYLATE LEVEL

## 2018-08-12 LAB — CBG MONITORING, ED
GLUCOSE-CAPILLARY: 100 mg/dL — AB (ref 70–99)
Glucose-Capillary: 85 mg/dL (ref 70–99)

## 2018-08-12 LAB — ETHANOL

## 2018-08-12 LAB — PHOSPHORUS: Phosphorus: 3.9 mg/dL (ref 2.5–4.6)

## 2018-08-12 LAB — MAGNESIUM: Magnesium: 1.8 mg/dL (ref 1.7–2.4)

## 2018-08-12 MED ORDER — SODIUM CHLORIDE 0.9 % IV BOLUS
1000.0000 mL | Freq: Once | INTRAVENOUS | Status: AC
  Administered 2018-08-12: 1000 mL via INTRAVENOUS

## 2018-08-12 MED ORDER — SODIUM CHLORIDE 0.9 % IV SOLN
1.0000 g | Freq: Once | INTRAVENOUS | Status: AC
  Administered 2018-08-12: 1 g via INTRAVENOUS
  Filled 2018-08-12: qty 10

## 2018-08-12 MED ORDER — PERMETHRIN 5 % EX CREA: TOPICAL_CREAM | CUTANEOUS | 0 refills | Status: DC

## 2018-08-12 NOTE — ED Notes (Signed)
Assisted pt to ambulate in hallway to and from restroom, able to do this with SBA, pt appears unsteady still but not as unsteady as during previous attempts to ambulate

## 2018-08-12 NOTE — ED Notes (Signed)
Smaller BP cuff placed on pt

## 2018-08-12 NOTE — Discharge Instructions (Addendum)
You have received antibiotics today for UTI. You do not need to take any additional antibiotics for this.  I have sent you a referral to neurology so you can follow-up with them about your symptoms. Please return to the emergency department if these symptoms continue and/or worsen.  For the rash, there was concern from PA Mia McDonald that it was scabies. She has prescribed you a cream called Permethrin for you to use. Please follow the instructions as directed. When you go home, be sure to wash all your clothes and bedding that you have come in contact with.  Be safe and take care of yourself.

## 2018-08-12 NOTE — ED Notes (Signed)
IV team bedside. 

## 2018-08-12 NOTE — ED Provider Notes (Signed)
  Physical Exam  BP (!) 109/58   Pulse (!) 52   Temp 97.7 F (36.5 C) (Oral)   Resp 10   Ht 5\' 7"  (1.702 m)   Wt 61.2 kg   SpO2 100%   BMI 21.14 kg/m   Physical Exam Performed by previous EDP, Frederik PearMia McDonald, PA-C.  ED Course/Procedures   Clinical Course as of Aug 13 1723  Sun Aug 12, 2018  1602 Case discussed with Frederik PearMia McDonald, PA-C at shift change. IV Rocephin 1g x1 will be ordered to treat for UTI and possible STI given patient's history. There was concern over patient's BP and that she was endorsing dizziness with standing and attempted ambulation. Patient can be discharged if she is able to ambulate without significant symptoms.   [GM]  1652 BP is back at pt's baseline of SBP 90-110. Will ask staff to ambulate patient.   [GM]  1720 This EDP ambulated with the patient in the hallway. Patient remained steady on her feet and did not endorse any symptoms such as dizziness, lightheadedness, paresthesias, palpitations, chest pain or near syncope. Per discussion with previous EDP, patient can be discharged. Strict return precautions were given.   [GM]    Clinical Course User Index [GM] Jettie Lazare, Sharyon MedicusGabrielle I, PA-C    Procedures  MDM  Tracy Stout is a 22 yo female with a history of polysubstance use and several psychiatric diagnoses presented to the ED for seizure-like activity. Work-up today was unremarkable and she had no significant neuro findings. Per previous discussion with EDP, plan is for patient to follow-up with outpatient neurology. Strict return precautions given. There is also a rash on that patient that is consistent with scabies. Permethrin cream prescribed.  Dispo: Home. After thorough clinical evaluation, this patient is determined to be medically stable and can be safely discharged with the previously mentioned treatment and/or outpatient follow-up/referral(s). At this time, there are no other apparent medical conditions that require further screening, evaluation or  treatment.      Reva BoresMortis, Tracy Sensing I, PA-C 08/12/18 1728    Benjiman CorePickering, Nathan, MD 08/13/18 0003

## 2018-08-12 NOTE — ED Provider Notes (Addendum)
[facility] EMERGENCY DEPARTMENT Provider Note   CSN: [id number] Arrival date & time: 08/12/18  1053     History   Chief Complaint Chief Complaint  Patient presents with  . Dizziness  . Loss of Consciousness    HPI Tracy Stout is Stout 22 y.o. female with Stout history of ADHD, bipolar 1 disorder, PTSD, and anxiety who presents to the emergency department with Stout chief complaint of seizure-like activity.  The patient endorses recurrent episodes of seizure-like activity, which she describes as feeling dizzy and lightheaded, rolling her eyes in the back of her head, and "blacking out" of her vision with shaking of her bilateral arms and legs.  Reports that she was seen for similar events at [facility] in August, but reports the episodes over the last week have been more severe.  She is unsure of how long the episodes last and how soon she returns to baseline after the episodes.  She reports that during one of the episodes approximately 1 week ago that she fell and hit the back of her head. Reports headache and dizziness have worsened since hitting her head.    She denies numbness, weakness, nausea, vomiting, abdominal pain,dysuria, chest pain, dyspnea, slurred speech, neck pain or stiffness.   The patient's mother reports that she has not witnessed any of the episodes.  She reports that the patient stays with her approximately every 1 out of every 4 days and stays with Stout friend otherwise.  She reports the patient went to bed at 2000 and has been much more sleepy than usual since last night.  She reports associated heroin, Xanax, and cocaine use.  Reports that she last snorted heroin over the last few days.  Will typically use Stout couple of times Stout week.  Reports that she had no episodes of seizure-like activity the days she used heroin and her headache completely resoved.  States she takes Xanax Stout couple times Stout month, but denies recent use.  Reports cocaine use "whenever it's  there and someone will give me some". She last used yesterday.  Denies EtOH, methamphetamine, and marijuana use.  She also endorses Stout pruritic rash that began 2 to 3 days ago to her right shoulder and is since spread to her back, abdomen, and bilateral legs, and right breast.  No one at home has Stout similar rash.  No treatment prior to arrival.  She is unsure if she could be pregnant.  She reports that she is currently sexually active with 2-3 female partners and intermittently uses protection.  No GU complaints.  The history is provided by the patient. No language interpreter was used.    Past Medical History:  Diagnosis Date  . ADHD (attention deficit hyperactivity disorder)   . Anxiety   . Bipolar 1 disorder (HCC)   . Depression   . Pneumonia   . PTSD (post-traumatic stress disorder)   . Supervision of normal pregnancy 06/30/2016    [facility] at   6+2 weeks FOB  [name] 22 yo bm 4th Dating By  [name]  Pap   NA GC/CT Initial:                36+wks: Genetic Screen NT/IT:  CF screen  Anatomic [name]  Flu vaccine  Tdap Recommended ~ 28wks Glucose Screen  2 hr GBS  Feed Preference  Contraception  Circumcision  Childbirth Classes  Pediatrician    . UTI (urinary tract infection) during pregnancy 09/2016  Patient Active Problem List   Diagnosis Date Noted  . H/O Deliberate self-cutting 03/21/2017  . Drug use 11/07/2016  . Anemia   . History of pyelonephritis during pregnancy 10/06/2016  . 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 Generalized anxiety disorder 10/25/2014  . 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 tubes in ears       OB History    Gravida  1   Para  1   Term  1   Preterm      AB      Living  1     SAB      TAB      Ectopic      Multiple  0   Live Births  1             Home Medications    Prior to Admission medications   Medication Sig Start Date End Date Taking? Authorizing Provider  acetaminophen (TYLENOL) 500 MG tablet Take 1,000 mg by mouth every 6 (six) hours as needed for mild pain.   Yes [provider]  citalopram (CELEXA) 40 MG tablet Take 1 tablet (40 mg total) by mouth daily. 03/31/17  Yes Cheral Marker, CNM  guanFACINE (INTUNIV) 1 MG TB24 ER tablet Take 1 mg by mouth every morning. 04/11/18  Yes [provider]  ibuprofen (ADVIL,MOTRIN) 200 MG tablet Take 400 mg by mouth every 6 (six) hours as needed for headache.   Yes [provider]  SUBOXONE 8-2 MG FILM Place 1 Film under the tongue daily. 04/18/18  Yes [provider]  traZODone (DESYREL) 50 MG tablet Take 50 mg by mouth at bedtime as needed. 04/11/18  Yes [provider]  VRAYLAR capsule Take 73 mg by mouth every morning. 04/11/18  Yes [provider]  permethrin (ELIMITE) 5 % cream Apply to affected area once 08/12/18   Marlaina Coburn A, PA-C    Family History Family History  Problem Relation Age of Onset  . Hypertension Paternal Grandmother   . Bipolar disorder Paternal Grandmother   . Depression Maternal Grandmother   . Stroke Maternal Grandfather        X 5  . Bipolar disorder Father   . ADD / ADHD Father   . Hypertension Father   . Drug abuse Father        drug overdose  . Depression Father   . Depression Mother   . ADD / ADHD Brother   . Bipolar disorder Sister   . Depression Sister   . ADD / ADHD Sister   . Other Sister        heart condition; had open heart surgery    Social History Social History   Tobacco Use  . Smoking status: Current Every Day Smoker    Packs/day: 1.00    Years: 3.00    Pack years: 3.00    Types: Cigarettes  . Smokeless tobacco: Never Used  Substance Use Topics  . Alcohol use: No  . Drug use: Yes    Types: Marijuana, Cocaine, Benzodiazepines, Other-see comments, IV     Comment: last used cocaine, marijuana, xanax, opiates on 05/11/18     Allergies   Patient has no known allergies.   Review of Systems Review of Systems  Constitutional: Negative for activity change, chills and fever.  HENT: Negative for congestion.  Eyes: Negative for visual disturbance.  Respiratory: Negative for shortness of breath.   Cardiovascular: Negative for chest pain.  Gastrointestinal: Negative for abdominal pain, diarrhea, nausea and vomiting.  Genitourinary: Negative for dysuria, hematuria, vaginal discharge and vaginal pain.  Musculoskeletal: Negative for back pain, neck pain and neck stiffness.  Skin: Positive for rash.  Allergic/Immunologic: Negative for immunocompromised state.  Neurological: Positive for dizziness, seizures, syncope, light-headedness and headaches. Negative for weakness.  Psychiatric/Behavioral: Negative for confusion.   Physical Exam Updated Vital Signs BP 103/61   Pulse (!) 53   Temp 97.7 F (36.5 C) (Oral)   Resp 17   Ht 5\' 7"  (1.702 m)   Wt 61.2 kg   SpO2 100%   BMI 21.14 kg/m   Physical Exam  Constitutional: No distress.  HENT:  Head: Normocephalic.  Mildly tender to palpation to the occipital scalp.  No obvious step-offs or crepitus.  No hematoma.  Eyes: Pupils are equal, round, and reactive to light. Conjunctivae and EOM are normal.  Neck: Normal range of motion. Neck supple.  Cardiovascular: Normal rate and regular rhythm. Exam reveals no gallop and no friction rub.  No murmur heard. Pulmonary/Chest: Effort normal. No stridor. No respiratory distress. She has no wheezes. She has no rales. She exhibits no tenderness.  Abdominal: Soft. She exhibits no distension and no mass. There is no tenderness. There is no rebound and no guarding. No hernia.  Lymphadenopathy:    She has no cervical adenopathy.  Neurological: She is alert.  Follow simple commands.  Drowsy, but easily arousable to voice.  Cranial nerves II through XII are  grossly intact.  5 out of 5 strength against resistance of the bilateral upper and lower extremities.  Sensation is intact and symmetric throughout.  Finger-to-nose is intact bilaterally.  Heel-to-shin is intact bilaterally.  Normal rapid alternating movements.  No pronator drift.  Skin: Skin is warm. Capillary refill takes less than 2 seconds. No rash noted. She is not diaphoretic. No pallor.  Grouped Erythematous maculopapular rash to the posterior right shoulder with similar lesions that are more linear to the back, abdomen, and bilateral legs.  No lesions noted to the bilateral wrists, ankles, palms, soles.  No pustules, vesicles, or petechiae.  Psychiatric: Her behavior is normal.  Nursing note and vitals reviewed.  ED Treatments / Results  Labs (all labs ordered are listed, but only abnormal results are displayed) Labs Reviewed  URINALYSIS, ROUTINE W REFLEX MICROSCOPIC - Abnormal; Notable for the following components:      Result Value   Color, Urine AMBER (*)    APPearance CLOUDY (*)    Hgb urine dipstick SMALL (*)    Leukocytes, UA MODERATE (*)    Bacteria, UA RARE (*)    All other components within normal limits  RAPID URINE DRUG SCREEN, HOSP PERFORMED - Abnormal; Notable for the following components:   Opiates POSITIVE (*)    Cocaine POSITIVE (*)    Benzodiazepines POSITIVE (*)    Tetrahydrocannabinol POSITIVE (*)    All other components within normal limits  COMPREHENSIVE METABOLIC PANEL - Abnormal; Notable for the following components:   Total Protein 6.3 (*)    Albumin 3.2 (*)    All other components within normal limits  ACETAMINOPHEN LEVEL - Abnormal; Notable for the following components:   Acetaminophen (Tylenol), Serum <10 (*)    All other components within normal limits  CBG MONITORING, ED - Abnormal; Notable for the following components:   Glucose-Capillary 100 (*)  All other components within normal limits  URINE CULTURE  SALICYLATE LEVEL  CBC WITH  DIFFERENTIAL/PLATELET  ETHANOL  MAGNESIUM  PHOSPHORUS  I-STAT BETA HCG BLOOD, ED ([facility], [facility], [facility] ONLY)  CBG MONITORING, ED    EKG EKG Interpretation  Date/Time:  Sunday August 12 2018 11:02:31 EST Ventricular Rate:  67 PR Interval:    QRS Duration: 91 QT Interval:  379 QTC Calculation: 400 R Axis:   83 Text Interpretation:  Sinus rhythm Artifact Otherwise within normal limits Confirmed by Gerhard Munch [phone]) on 08/12/2018 11:06:51 AM   Radiology No results found.  Procedures Procedures (including critical care time)  Medications Ordered in ED Medications  cefTRIAXone (ROCEPHIN) 1 g in sodium chloride 0.9 % 100 mL IVPB (1 g Intravenous New Bag/Given 08/12/18 1707)  sodium chloride 0.9 % bolus 1,000 mL (0 mLs Intravenous Stopped 08/12/18 1334)  sodium chloride 0.9 % bolus 1,000 mL (0 mLs Intravenous Stopped 08/12/18 1419)     Initial Impression / Assessment and Plan / ED Course  I have reviewed the triage vital signs and the nursing notes.  Pertinent labs & imaging results that were available during my care of the patient were reviewed by me and considered in my medical decision making (see chart for details).  23 year old female with Stout history of ADHD, bipolar 1 disorder, PTSD, and anxiety who presents to the emergency department with Stout chief complaint of seizure-like activity.  Episodes are concerning for syncopal versus seizure-like activity.  She describes the episodes as dizziness and lightheadedness with darkening of her vision then "blacking out" with shaking of the arms and legs.  She was seen in the ED on May 13, 2018 after having similar episodes and it was thought to be secondary to polysubstance abuse.  Patient does endorse recent use of benzodiazepines, opioids, and cocaine.  Last drug use was cocaine yesterday.  The patient was discussed at length with Dr. Jeraldine Loots, attending physician.  She has no focal neurologic deficits on exam, but the patient becomes  extremely dizzy and lightheaded with standing.  She was unable to complete her orthostatic vital signs due to being symptomatic.  Initial blood pressure was 110/60.  After reviewing the patient's medical record, it appears that her baseline blood pressure is 90s over 50s.  Was notified by nursing staff that the patient's heart rate decreased from 70s to 50s and her blood pressure decreased to 73/40.  The patient was fluid resuscitated with 2 L with minimal improvement in her blood pressure.  After receiving the 2 L, I attempted to help the patient stand and ambulate to the bathroom, but she appeared very off balance and had to sit back down.  UDS is positive for opioids, cocaine, benzodiazepines, and THC.  UA with small hemoglobinuria, moderate leukocyte esterase, and pyuria.  The patient's mother reports that she has had UTIs in the past and has been asymptomatic.  Urine culture sent.  IV Rocephin given in the ED.  The patient's mother has expressed adamant concern that the patient has not been caring for herself and eating and drinking well over the last few weeks.  We had Stout lengthy shared decision making conversation as given her MAP and hypotension with symptomatic dizziness she seemed reasonable to admit for observation.  The patient was adamant that she did not want to be admitted.  Since the patient's urine and UDS were pending at this time, discussed reevaluating after UA was back and if the patient was ambulatory without being symptomatic and  blood pressures were at her baseline then could consider sending her home with outpatient follow-up.  The patient was agreeable to the this plan at that time.  Of note, the patient also notes Stout pruritic rash to her back, abdomen, and legs for the last few days.  No interdigital involvement or burrows are noted, but there is some concern it could be scabies so I will plan to discharge her with permethrin as well as extensive counseling on polysubstance abuse and  outpatient neurology follow-up.  At shift change, the patient is receiving Rocephin and is pending ambulation. Patient care transferred to PA Mortis at the end of my shift. Patient presentation, ED course, and plan of care discussed with review of all pertinent labs and imaging. Please see his/her note for further details regarding further ED course and disposition.   Clinical Course as of Aug 12 1726  Wynelle LinkSun Aug 12, 2018  1602 Case discussed with Tracy PearMia Pasqualino Witherspoon, PA-C at shift change. IV Rocephin 1g x1 will be ordered to treat for UTI and possible STI given patient's history. There was concern over patient's BP and that she was endorsing dizziness with standing and attempted ambulation. Patient can be discharged if she is able to ambulate without significant symptoms.   [GM]  1652 BP is back at pt's baseline of SBP 90-110. Will ask staff to ambulate patient.   [GM]  1720 This EDP ambulated with the patient in the hallway. Patient remained steady on her feet and did not endorse any symptoms such as dizziness, lightheadedness, paresthesias, palpitations, chest pain or near syncope. Per discussion with previous EDP, patient can be discharged. Strict return precautions were given.   [GM]    Clinical Course User Index [GM] Windy CarinaMortis, Gabrielle I, [place]PA-C     Final Clinical Impressions(s) / ED Diagnoses   Final diagnoses:  Dizziness  Rash    ED Discharge Orders         Ordered    permethrin (ELIMITE) 5 % cream     08/12/18 1627    Ambulatory referral to Neurology    Comments:  An appointment is requested in approximately: 1 week   08/12/18 1725           Tracy Stout, Tracy Cato A, PA-C 08/12/18 1657    Emersyn Kotarski A, PA-C 08/12/18 1727    Gerhard MunchLockwood, Robert, MD 08/15/18 1756

## 2018-08-12 NOTE — ED Notes (Addendum)
Unable to provide urine sample using BSC at this time, assisted pt back to bed. IV team at bedside

## 2018-08-12 NOTE — ED Notes (Signed)
Pt refused purewick, uses bedside commode

## 2018-08-12 NOTE — ED Notes (Signed)
Patient verbalizes understanding of discharge instructions. Opportunity for questioning and answers were provided. Armband removed by staff, pt discharged from ED ambulatory with mother.  

## 2018-08-12 NOTE — ED Notes (Signed)
Pt sleeping. 

## 2018-08-12 NOTE — ED Notes (Signed)
Pt is difficult IV stick, 2 unsuccessful IV starts but 2 RNs. IV team consulted.

## 2018-08-12 NOTE — ED Notes (Signed)
Family assisting pt to get dressed

## 2018-08-12 NOTE — ED Triage Notes (Signed)
Pt arrives to ED with complaints of dizziness and short periods of syncope for the past week, last occurring yesterday while she was walking. Pt states she remembers most of the events but before it happens, she will "feel dizzy and lightheaded and my vision will go black and I'll have headaches. Also, this isn't my main concern, but I have a rash all over my body... Shoulder, lower abdomen, boobs... It does itch and I put benadryl cream on it." Mother bedside, states she has not witnessed any of the episodes. Pt denies fevers, chills, nausea, or vomiting.

## 2018-08-12 NOTE — ED Notes (Signed)
Notified provider of low BP

## 2018-08-13 ENCOUNTER — Encounter: Payer: Self-pay | Admitting: Neurology

## 2018-08-15 LAB — URINE CULTURE

## 2018-08-16 ENCOUNTER — Telehealth: Payer: Self-pay | Admitting: *Deleted

## 2018-08-16 NOTE — Progress Notes (Signed)
ED Antimicrobial Stewardship Positive Culture Follow Up   Tracy Stout is an 22 y.o. female who [facility] with a chief complaint of  Chief Complaint  Patient presents with  . Dizziness  . Loss of Consciousness    Recent Results (from the past 720 hour(s))  Urine culture     Status: Abnormal   Collection Time: 08/12/18  3:59 PM  Result Value Ref Range Status   Specimen Description URINE, RANDOM  Final   Special Requests   Final    NONE Performed at [facility], [address] [place], [place] [address]    Culture >=100,000 COLONIES/mL ESCHERICHIA COLI (A)  Final   Report Status 08/15/2018 FINAL  Final   Organism ID, Bacteria ESCHERICHIA COLI (A)  Final      Susceptibility   Escherichia coli - MIC*    AMPICILLIN 8 SENSITIVE Sensitive     CEFAZOLIN <=4 SENSITIVE Sensitive     CEFTRIAXONE <=1 SENSITIVE Sensitive     CIPROFLOXACIN <=0.25 SENSITIVE Sensitive     GENTAMICIN <=1 SENSITIVE Sensitive     IMIPENEM <=0.25 SENSITIVE Sensitive     NITROFURANTOIN <=16 SENSITIVE Sensitive     TRIMETH/SULFA <=20 SENSITIVE Sensitive     AMPICILLIN/SULBACTAM 4 SENSITIVE Sensitive     PIP/TAZO <=4 SENSITIVE Sensitive     Extended ESBL NEGATIVE Sensitive     * >=100,000 COLONIES/mL ESCHERICHIA COLI    []  Treated with N/A, organism resistant to prescribed antimicrobial [x]  Patient discharged originally without antimicrobial agent and treatment is now indicated  New antibiotic prescription: If pt with UTI symptoms, start amoxicillin 875mg  PO BID x 5 days  ED Provider: Sherryl Mangesaitlyn Kendrick, PA   Tracy Stout, Tracy LeachRachel Stout 08/16/2018, 8:26 AM Clinical Pharmacist Monday - Friday phone -  [phone] Saturday - Sunday phone - [phone]

## 2018-08-16 NOTE — Telephone Encounter (Signed)
Received call back from patient who states she is feeling better and no urinary symptoms.  No further treatment provided at this time.

## 2018-08-16 NOTE — Telephone Encounter (Signed)
Post ED Visit - Positive Culture Follow-up: Unsuccessful Patient Follow-up  Culture assessed and recommendations reviewed by:  []  Enzo BiNathan Batchelder, Pharm.D. []  Celedonio MiyamotoJeremy Frens, Pharm.D., BCPS AQ-ID []  Garvin FilaMike Maccia, Pharm.D., BCPS []  Georgina PillionElizabeth Martin, Pharm.D., BCPS []  BadenMinh Pham, [address] BoulevardPharm.D., BCPS, AAHIVP []  Estella HuskMichelle Turner, Pharm.D., BCPS, AAHIVP []  Sherlynn CarbonAustin Lucas, PharmD []  Pollyann SamplesAndy Johnston, PharmD, BCPS Jairo Benachel Rumberger, Pharm D  Positive urine culture  [x]  Patient discharged without antimicrobial prescription and treatment is now indicated if symptomatic []  Organism is resistant to prescribed ED discharge antimicrobial []  Patient with positive blood cultures  Plan:  If symptomatic start Bactrim  6mls (2200mg -40mg /875ml) PO BID x 3 days  Unable to contact patient after 3 attempts, letter will be sent to address on file  Lysle PearlRobertson, Issacc Merlo Talley 08/16/2018, 9:30 AM

## 2018-09-20 ENCOUNTER — Ambulatory Visit: Payer: Medicaid Other | Admitting: Obstetrics and Gynecology

## 2018-09-20 ENCOUNTER — Encounter: Payer: Self-pay | Admitting: *Deleted

## 2018-10-28 ENCOUNTER — Other Ambulatory Visit: Payer: Self-pay

## 2018-10-28 ENCOUNTER — Encounter (HOSPITAL_COMMUNITY): Payer: Self-pay | Admitting: Emergency Medicine

## 2018-10-28 ENCOUNTER — Emergency Department (HOSPITAL_COMMUNITY)
Admission: EM | Admit: 2018-10-28 | Discharge: 2018-10-28 | Disposition: A | Discharge status: Home / Self Care | Payer: Medicaid Other | Attending: Emergency Medicine | Admitting: Emergency Medicine

## 2018-10-28 DIAGNOSIS — T50901A Poisoning by unspecified drugs, medicaments and biological substances, accidental (unintentional), initial encounter: Secondary | ICD-10-CM | POA: Insufficient documentation

## 2018-10-28 DIAGNOSIS — F1910 Other psychoactive substance abuse, uncomplicated: Secondary | ICD-10-CM | POA: Diagnosis not present

## 2018-10-28 DIAGNOSIS — R7989 Other specified abnormal findings of blood chemistry: Secondary | ICD-10-CM

## 2018-10-28 DIAGNOSIS — F17210 Nicotine dependence, cigarettes, uncomplicated: Secondary | ICD-10-CM | POA: Diagnosis not present

## 2018-10-28 DIAGNOSIS — R945 Abnormal results of liver function studies: Secondary | ICD-10-CM | POA: Diagnosis not present

## 2018-10-28 DIAGNOSIS — F909 Attention-deficit hyperactivity disorder, unspecified type: Secondary | ICD-10-CM | POA: Insufficient documentation

## 2018-10-28 DIAGNOSIS — Z79899 Other long term (current) drug therapy: Secondary | ICD-10-CM | POA: Diagnosis not present

## 2018-10-28 DIAGNOSIS — R4182 Altered mental status, unspecified: Secondary | ICD-10-CM | POA: Diagnosis present

## 2018-10-28 HISTORY — DX: Other psychoactive substance abuse, uncomplicated: F19.10

## 2018-10-28 LAB — CBC WITH DIFFERENTIAL/PLATELET
Abs Immature Granulocytes: 0.01 10*3/uL (ref 0.00–0.07)
BASOS PCT: 1 %
Basophils Absolute: 0 10*3/uL (ref 0.0–0.1)
EOS ABS: 0.2 10*3/uL (ref 0.0–0.5)
Eosinophils Relative: 3 %
HCT: 43.4 % (ref 36.0–46.0)
Hemoglobin: 13.6 g/dL (ref 12.0–15.0)
Immature Granulocytes: 0 %
Lymphocytes Relative: 51 %
Lymphs Abs: 2.9 10*3/uL (ref 0.7–4.0)
MCH: 29.4 pg (ref 26.0–34.0)
MCHC: 31.3 g/dL (ref 30.0–36.0)
MCV: 93.9 fL (ref 80.0–100.0)
MONOS PCT: 8 %
Monocytes Absolute: 0.5 10*3/uL (ref 0.1–1.0)
NEUTROS PCT: 37 %
Neutro Abs: 2.1 10*3/uL (ref 1.7–7.7)
PLATELETS: 202 10*3/uL (ref 150–400)
RBC: 4.62 MIL/uL (ref 3.87–5.11)
RDW: 12.6 % (ref 11.5–15.5)
WBC: 5.6 10*3/uL (ref 4.0–10.5)
nRBC: 0 % (ref 0.0–0.2)

## 2018-10-28 LAB — COMPREHENSIVE METABOLIC PANEL
ALBUMIN: 4.3 g/dL (ref 3.5–5.0)
ALT: 104 U/L — ABNORMAL HIGH (ref 0–44)
ANION GAP: 8 (ref 5–15)
AST: 75 U/L — ABNORMAL HIGH (ref 15–41)
Alkaline Phosphatase: 68 U/L (ref 38–126)
BUN: 13 mg/dL (ref 6–20)
CALCIUM: 8.9 mg/dL (ref 8.9–10.3)
CHLORIDE: 107 mmol/L (ref 98–111)
CO2: 27 mmol/L (ref 22–32)
Creatinine, Ser: 0.72 mg/dL (ref 0.44–1.00)
GFR calc Af Amer: >60 mL/min (ref >60)
GFR calc non Af Amer: >60 mL/min (ref >60)
GLUCOSE: 80 mg/dL (ref 70–99)
POTASSIUM: 3.6 mmol/L (ref 3.5–5.1)
SODIUM: 142 mmol/L (ref 135–145)
Total Bilirubin: 0.5 mg/dL (ref 0.3–1.2)
Total Protein: 7.4 g/dL (ref 6.5–8.1)

## 2018-10-28 LAB — ACETAMINOPHEN LEVEL

## 2018-10-28 LAB — ETHANOL: Alcohol, Ethyl (B): <10 mg/dL (ref <10)

## 2018-10-28 LAB — RAPID URINE DRUG SCREEN, HOSP PERFORMED
Amphetamines: POSITIVE — AB
BARBITURATES: NOT DETECTED
Benzodiazepines: POSITIVE — AB
Cocaine: POSITIVE — AB
Opiates: POSITIVE — AB
TETRAHYDROCANNABINOL: POSITIVE — AB

## 2018-10-28 LAB — SALICYLATE LEVEL

## 2018-10-28 LAB — PREGNANCY, URINE: Preg Test, Ur: NEGATIVE

## 2018-10-28 MED ORDER — NALOXONE HCL 0.4 MG/ML IJ SOLN
0.4000 mg | Freq: Once | INTRAMUSCULAR | Status: AC
  Administered 2018-10-28: 0.4 mg via INTRAVENOUS
  Filled 2018-10-28: qty 1

## 2018-10-28 NOTE — ED Provider Notes (Signed)
[facility] EMERGENCY DEPARTMENT Provider Note   CSN: [id number] Arrival date & time: 10/28/18  1305     History   Chief Complaint No chief complaint on file.   HPI Tracy Stout is a 23 y.o. female.  The history is provided by the patient. The history is limited by the condition of the patient (AMS).   Pt was seen at 1300. Pt arrived unresponsive via vehicle driven by family/friend. Possible OD. Pt unresponsive, shallow resps on arrival.    Past Medical History:  Diagnosis Date  . ADHD (attention deficit hyperactivity disorder)   . Anxiety   . Bipolar 1 disorder (HCC)   . Depression   . Pneumonia   . Polysubstance abuse (HCC)   . PTSD (post-traumatic stress disorder)   . Supervision of normal pregnancy 06/30/2016    [facility] at   6+2 weeks FOB  [place] 23 yo bm 4th Dating By  US  Pap   NA GC/CT Initial:                36+wks: Genetic Screen NT/IT:  CF screen  Anatomic US  Flu vaccine  Tdap Recommended ~ 28wks Glucose Screen  2 hr GBS  Feed Preference  Contraception  Circumcision  Childbirth Classes  Pediatrician    . UTI (urinary tract infection) during pregnancy 09/2016    Patient Active Problem List   Diagnosis Date Noted  . H/O Deliberate self-cutting 03/21/2017  . Drug use 11/07/2016  . Anemia   . History of pyelonephritis during pregnancy 10/06/2016  . 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 Generalized anxiety disorder 10/25/2014  . 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 tubes in ears       OB History    Gravida  1   Para  1   Term  1   Preterm      AB      Living  1     SAB      TAB      Ectopic      Multiple  0   Live Births  1            Home Medications    Prior to Admission medications   Medication Sig Start Date End Date  Taking? Authorizing Provider  acetaminophen (TYLENOL) 500 MG tablet Take 1,000 mg by mouth every 6 (six) hours as needed for mild pain.    [provider]  citalopram (CELEXA) 40 MG tablet Take 1 tablet (40 mg total) by mouth daily. 03/31/17   Cheral MarkerBooker, Kimberly R, CNM  guanFACINE (INTUNIV) 1 MG TB24 ER tablet Take 1 mg by mouth every morning. 04/11/18   [provider]  ibuprofen (ADVIL,MOTRIN) 200 MG tablet Take 400 mg by mouth every 6 (six) hours as needed for headache.    [provider]  permethrin (ELIMITE) 5 % cream Apply to affected area once 08/12/18   McDonald, Mia A, PA-C  SUBOXONE 8-2 MG FILM Place 1 Film under the tongue daily. 04/18/18   [provider]  traZODone (DESYREL) 50 MG tablet Take 50 mg by mouth at bedtime as needed. 04/11/18   [provider]  VRAYLAR capsule Take 73 mg by mouth every morning. 04/11/18   [provider]    Family History Family History  Problem Relation Age of Onset  .  Hypertension Paternal Grandmother   . Bipolar disorder Paternal Grandmother   . Depression Maternal Grandmother   . Stroke Maternal Grandfather        X 5  . Bipolar disorder Father   . ADD / ADHD Father   . Hypertension Father   . Drug abuse Father        drug overdose  . Depression Father   . Depression Mother   . ADD / ADHD Brother   . Bipolar disorder Sister   . Depression Sister   . ADD / ADHD Sister   . Other Sister        heart condition; had open heart surgery    Social History Social History   Tobacco Use  . Smoking status: Current Every Day Smoker    Packs/day: 1.00    Years: 3.00    Pack years: 3.00    Types: Cigarettes  . Smokeless tobacco: Never Used  Substance Use Topics  . Alcohol use: No  . Drug use: Yes    Types: Marijuana, Cocaine, Benzodiazepines, Other-see comments, IV    Comment: last used cocaine, marijuana, xanax, opiates on 05/11/18     Allergies   Patient has no known  allergies.   Review of Systems Review of Systems  Unable to perform ROS: Mental status change     Physical Exam Updated Vital Signs BP 112/75   Pulse 78   Temp 98.8 F (37.1 C) (Oral)   Resp 17   Ht 5\' 7"  (1.702 m)   Wt 59 kg   LMP 09/30/2018 (Approximate)   SpO2 99%   BMI 20.36 kg/m   Physical Exam 1305: Physical examination:  Nursing notes reviewed; Vital signs and O2 SAT reviewed;  Constitutional: Well developed, Well nourished, Well hydrated, In no acute distress; Head:  Normocephalic, atraumatic; Eyes: EOMI, +pinpoint pupils. No scleral icterus; ENMT: Mouth and pharynx normal, Mucous membranes moist; Neck: Supple, Full range of motion, No lymphadenopathy; Cardiovascular: Regular rate and rhythm, No gallop; Respiratory: Breath sounds clear & equal bilaterally, No wheezes. Normal respiratory effort/excursion; Chest: Nontender, Movement normal; Abdomen: Soft, Nontender, Nondistended, Normal bowel sounds; Genitourinary: No CVA tenderness; Extremities: Peripheral pulses normal, No tenderness, No edema, No calf edema or asymmetry.; Neuro: Somnolent, will open eyes to name and speak short sentences intermittently with loud/touch stimuli. Speech slurred. Moves all extremities spontaneously..; Skin: Color normal, Warm, Dry.    ED Treatments / Results  Labs (all labs ordered are listed, but only abnormal results are displayed)   EKG None  Radiology   Procedures Procedures (including critical care time)  Medications Ordered in ED Medications  naloxone (NARCAN) injection 0.4 mg (has no administration in time range)     Initial Impression / Assessment and Plan / ED Course  I have reviewed the triage vital signs and the nursing notes.  Pertinent labs & imaging results that were available during my care of the patient were reviewed by me and considered in my medical decision making (see chart for details).  MDM Reviewed: previous chart, nursing note and  vitals Interpretation: labs Total time providing critical care: 30-74 minutes. This excludes time spent performing separately reportable procedures and services.   CRITICAL CARE Performed by: Samuel Jester Total critical care time: 35 minutes Critical care time was exclusive of separately billable procedures and treating other patients. Critical care was necessary to treat or prevent imminent or life-threatening deterioration. Critical care was time spent personally by me on the following activities: development of treatment plan with  patient and/or surrogate as well as nursing, discussions with consultants, evaluation of patient's response to treatment, examination of patient, obtaining history from patient or surrogate, ordering and performing treatments and interventions, ordering and review of laboratory studies, ordering and review of radiographic studies, pulse oximetry and re-evaluation of patient's condition.   Results for orders placed or performed during the hospital encounter of 10/28/18  Pregnancy, urine  Result Value Ref Range   Preg Test, Ur NEGATIVE NEGATIVE  Rapid urine drug screen (hospital performed)  Result Value Ref Range   Opiates POSITIVE (A) NONE DETECTED   Cocaine POSITIVE (A) NONE DETECTED   Benzodiazepines POSITIVE (A) NONE DETECTED   Amphetamines POSITIVE (A) NONE DETECTED   Tetrahydrocannabinol POSITIVE (A) NONE DETECTED   Barbiturates NONE DETECTED NONE DETECTED  Comprehensive metabolic panel  Result Value Ref Range   Sodium 142 135 - 145 mmol/L   Potassium 3.6 3.5 - 5.1 mmol/L   Chloride 107 98 - 111 mmol/L   CO2 27 22 - 32 mmol/L   Glucose, Bld 80 70 - 99 mg/dL   BUN 13 6 - 20 mg/dL   Creatinine, Ser 1.610.72 0.44 - 1.00 mg/dL   Calcium 8.9 8.9 - 09.610.3 mg/dL   Total Protein 7.4 6.5 - 8.1 g/dL   Albumin 4.3 3.5 - 5.0 g/dL   AST 75 (H) 15 - 41 U/L   ALT 104 (H) 0 - 44 U/L   Alkaline Phosphatase 68 38 - 126 U/L   Total Bilirubin 0.5 0.3 - 1.2 mg/dL    GFR calc non Af Amer >60 >60 mL/min   GFR calc Af Amer >60 >60 mL/min   Anion gap 8 5 - 15  Ethanol  Result Value Ref Range   Alcohol, Ethyl (B) <10 <10 mg/dL  Acetaminophen level  Result Value Ref Range   Acetaminophen (Tylenol), Serum <10 (L) 10 - 30 ug/mL  Salicylate level  Result Value Ref Range   Salicylate Lvl <7.0 2.8 - 30.0 mg/dL  CBC with Differential  Result Value Ref Range   WBC 5.6 4.0 - 10.5 K/uL   RBC 4.62 3.87 - 5.11 MIL/uL   Hemoglobin 13.6 12.0 - 15.0 g/dL   HCT 04.543.4 40.936.0 - 81.146.0 %   MCV 93.9 80.0 - 100.0 fL   MCH 29.4 26.0 - 34.0 pg   MCHC 31.3 30.0 - 36.0 g/dL   RDW 91.412.6 78.211.5 - 95.615.5 %   Platelets 202 150 - 400 K/uL   nRBC 0.0 0.0 - 0.2 %   Neutrophils Relative % 37 %   Neutro Abs 2.1 1.7 - 7.7 K/uL   Lymphocytes Relative 51 %   Lymphs Abs 2.9 0.7 - 4.0 K/uL   Monocytes Relative 8 %   Monocytes Absolute 0.5 0.1 - 1.0 K/uL   Eosinophils Relative 3 %   Eosinophils Absolute 0.2 0.0 - 0.5 K/uL   Basophils Relative 1 %   Basophils Absolute 0.0 0.0 - 0.1 K/uL   Immature Granulocytes 0 %   Abs Immature Granulocytes 0.01 0.00 - 0.07 K/uL    1305:  Pt will awaken to loud stimuli. Admits to marijuana, xanax and heroin. She does not know if heroin was cut with fentanyl. Pt however arrived to ED with another individual that was taking drugs with her, and admitted they also took "bars of xanax pressed with fentanyl." IV narcan ordered. Will continue to monitor.    1320: Pt awake after narcan. Now admits she took whatever the other individual stated they took. Possibility  of pregnancy; will check urine preg.   1500:  Pt now very awake/alert, NAD, resps easy, neuro non-focal. Pt has tol PO well while in the ED without N/V. Has ambulated with steady gait. States she feels she is at her baseline and wants to leave now. Dx and testing d/w pt.  Questions answered.  Verb understanding, agreeable to d/c home with outpt f/u.      Final Clinical Impressions(s) / ED Diagnoses    Final diagnoses:  None    ED Discharge Orders    None       Samuel Jester, DO 10/31/18 0093

## 2018-10-28 NOTE — ED Triage Notes (Signed)
Pt brought in by POV for overdose. Pt was minimally responsive at baseline initially. Pt would arouse to verbal stimuli. Pt admits to using Zanax, THC, and snorting heroin. VSS. Intermittent periods of drowsiness, but pt is easily aroused.

## 2018-10-28 NOTE — ED Notes (Signed)
[employer] officers at bedside.

## 2018-10-28 NOTE — ED Notes (Signed)
Pt alert and agitated immediately after narcan administration. PT wants her belongings out of the vehicle that dropped her off and wants to speak with her boyfriend who is another patient. Explained to pt what we will attempt to get her belongings, but could not see boyfriend at this time due to him being a patient as well. Pt verbalized understanding.

## 2018-10-28 NOTE — Discharge Instructions (Signed)
Substance Abuse Treatment Programs ° °Intensive Outpatient Programs °[facility]     °[address]      °[place], [place]                   °[phone]      ° °[facility] °[address] °[place], [place] °[phone] ° °[facility]     °(Inpatient and outpatient)     °[address]           °[phone]   ° °[facility] °[phone] (Suboxone and Methadone) ° °[address]      °[place], [place] [address]      °[phone]      ° °[address] °[place], [place] °[phone] ° °[facility] (Outpatient/Inpatient, Chemical)    °(insurance only) [phone]      °       °[facility] (Groups & Residential) °[place], [place] °[phone] ° °   °[facility]     °[address]     °[place], [place]      °[phone]      ° °[facility] (for caregivers and family) °[address] °[place], [place] °[phone] ° ° ° ° ° °Residential Treatment Programs °[facility]      °[address], [place], [place] [address]  °[phone]      ° °[facility] °[address] [place], [place] [address] °[phone] ° °[facility]        °[phone]      ° °[facility] °[phone] ° °[facility]             °[address]                                         °[place], [place]                                                °[phone] or [phone]                              ° °[facility] [place] °[address] °[place], [address] °[phone] ° °[facility]    °[address]      °, [place]     °[phone]      ° °[facility] °[address] °, [place] °[phone] ° °[facility]   °[address]     °[place], [place] [address]     °[phone]      °Admissions: 8am-3pm M-F ° °[facility] °[address] °[place],  [place] °[phone] ° °[facility]: Residential Program (90 Days)   °[place], [place]      °[phone] or [phone]    ° °[facility] °[place], [place] °(Walk in Hours over the weekend or by referral) ° °[facility] °[address], [place], [place] [address] °([phone] ° °Crisis Mobile: [facility]:  1-877-626-1772 (for crisis response 24 hours a day) °[facility] Hotline:      1-800-256-2452 °Outpatient Psychiatry and Counseling ° °[facility]: Mobile Crisis   Management 24 hours:  1-579-867-5796  [facility] of the [employer] sliding scale fee and walk in schedule: M-F 8am-12pm/1pm-3pm [address]  [place], [place] [address] [place], [place] [address] [phone]  [facility] ([facility])- new patient walk-in appointments available Monday - Friday 8am -3pm.          [address] [place], [place] [address] [phone] or crisis line- [facility]/ Intensive Outpatient Therapy Program [place], [place] [address] [place]      [phone] [place], [place] [address]                 [place]   [facility] [phone] [place], [place] [address]   [employer]          [address] Johnette Abraham  [place], [place] [address]       [phone]  [facility] [address] [place], [place] [address] [phone]  [facility]    [address] [place], [place] [address]     [place], [place]celyn [place]     [place] [address]     [phone]       [facility] [place] [address]  [facility]     [address], [place], [place], [place] [address] [place]     [address]     [place], [place] [address]     [phone]       [facility] [address] [place], [place] [address] [phone] Resources for Temporary Residential Assistance/Crisis [facility]) M-F 8am-3pm   [address] [place], [place] [address]   [phone] Services include: laundry, barbering, support groups, case management, phone  & computer access, showers, AA/NA mtgs, mental health/substance abuse nurse, job skills class, disability information, VA assistance, spiritual classes, etc.   [facility]   [address], [place]     [place]              [employer] (women and children)       [place]. [place], [place] [address] [phone] [email or web address] for application and process Application Required  [facility]   [address]    [place] [address]     [phone]                    [place],  [address] [phone](OILNZVJK application appt.) Application Required  [facility] (women only)    [address]     [place],  [address]     [phone]  Intake starts 6pm daily Need valid ID, SSC, & Police report [employer] [address] [place], [place] [phone] Application Required  [facility] (men only)     [address]      [place], [place]     [phone]       [facility] [place] (Pregnant women only) [address] [place], [place] [phone]  [facility]      [address]      [place], [place] [address]     [phone]             [facility] [address] [place], [place] [phone] 90 day commitment/SA/Application process  [facility](men only)     [address]     [place], [place]     [phone]       Check-in at [facility] [address] [place], [place] [address] [phone] Men/Women/Women and Children must be there by 7 pm  [facility] [place], [place] [phone]                  Your liver function tests were mildly elevated today. You will need these rechecked in the next week. Avoid alcohol, tylenol and tylenol containing products until you are seen in follow up.  Call your regular medical doctor on Monday to schedule a follow up appointment within the next week.  Return to the Emergency Department immediately sooner if worsening.

## 2018-10-28 NOTE — ED Notes (Signed)
Patient stated she does not ever do drugs. Past medical history suggest otherwise.

## 2018-10-28 NOTE — ED Notes (Signed)
Officers still speaking with patient. Will d/c when investigation is complete.

## 2018-10-31 ENCOUNTER — Ambulatory Visit: Payer: Medicaid Other | Admitting: Neurology

## 2018-11-29 ENCOUNTER — Emergency Department (HOSPITAL_COMMUNITY)
Admission: EM | Admit: 2018-11-29 | Discharge: 2018-11-29 | Disposition: A | Discharge status: Home / Self Care | Payer: Medicaid Other | Attending: Emergency Medicine | Admitting: Emergency Medicine

## 2018-11-29 ENCOUNTER — Other Ambulatory Visit: Payer: Self-pay

## 2018-11-29 DIAGNOSIS — F909 Attention-deficit hyperactivity disorder, unspecified type: Secondary | ICD-10-CM | POA: Insufficient documentation

## 2018-11-29 DIAGNOSIS — R945 Abnormal results of liver function studies: Secondary | ICD-10-CM | POA: Diagnosis present

## 2018-11-29 DIAGNOSIS — Z79899 Other long term (current) drug therapy: Secondary | ICD-10-CM | POA: Insufficient documentation

## 2018-11-29 DIAGNOSIS — R748 Abnormal levels of other serum enzymes: Secondary | ICD-10-CM

## 2018-11-29 DIAGNOSIS — F17210 Nicotine dependence, cigarettes, uncomplicated: Secondary | ICD-10-CM | POA: Insufficient documentation

## 2018-11-29 LAB — CBC
HCT: 43.2 % (ref 36.0–46.0)
HEMOGLOBIN: 13.5 g/dL (ref 12.0–15.0)
MCH: 29.2 pg (ref 26.0–34.0)
MCHC: 31.3 g/dL (ref 30.0–36.0)
MCV: 93.3 fL (ref 80.0–100.0)
Platelets: 164 10*3/uL (ref 150–400)
RBC: 4.63 MIL/uL (ref 3.87–5.11)
RDW: 12.3 % (ref 11.5–15.5)
WBC: 5.4 10*3/uL (ref 4.0–10.5)
nRBC: 0 % (ref 0.0–0.2)

## 2018-11-29 LAB — COMPREHENSIVE METABOLIC PANEL
ALBUMIN: 3.7 g/dL (ref 3.5–5.0)
ALK PHOS: 123 U/L (ref 38–126)
ALT: 181 U/L — AB (ref 0–44)
AST: 50 U/L — ABNORMAL HIGH (ref 15–41)
Anion gap: 7 (ref 5–15)
BUN: 11 mg/dL (ref 6–20)
CALCIUM: 8.5 mg/dL — AB (ref 8.9–10.3)
CO2: 29 mmol/L (ref 22–32)
CREATININE: 1 mg/dL (ref 0.44–1.00)
Chloride: 101 mmol/L (ref 98–111)
GFR calc Af Amer: >60 mL/min (ref >60)
GFR calc non Af Amer: >60 mL/min (ref >60)
GLUCOSE: 80 mg/dL (ref 70–99)
Potassium: 4.2 mmol/L (ref 3.5–5.1)
SODIUM: 137 mmol/L (ref 135–145)
Total Bilirubin: 0.5 mg/dL (ref 0.3–1.2)
Total Protein: 6.7 g/dL (ref 6.5–8.1)

## 2018-11-29 NOTE — Care Management Note (Signed)
Case Management Note  Patient Details  Name: Tracy Stout MRN: [id number] Date of Birth: 10-May-1996  Subjective/Objective:                  Abn Labs  Action/Plan: ED CM spoke with the patient and her mother at the bedside. Patient needs a PCP. CM provided the patient with a flyer for [facility] and encouraged to call the clinic tomorrow to schedule an appointment for follow-up. Patient agrees.  Expected Discharge Date:     11/29/2018             Expected Discharge Plan:  Home/Self Care  In-House Referral:  PCP / [facility] Connect  Discharge planning Services  Indigent Health Clinic  Post Acute Care Choice:    Choice offered to:     DME Arranged:    DME Agency:     HH Arranged:    HH Agency:     Status of Service:     If discussed at [employer], dates discussed:    Additional Comments:  Antony Haste, RN 11/29/2018, 8:22 PM

## 2018-11-29 NOTE — ED Triage Notes (Signed)
Pt here from home for follow up on some abd liver labs that she had done earlier this week , no complaints of pain

## 2018-11-29 NOTE — Discharge Instructions (Signed)
As we discussed today, your liver enzymes were slightly elevated.  They seem to be decreased from your previous lab work.  We have also sent a hepatitis panel.  This will take 24 to 48 hours to come back.  If if there are positive results, you will be notified.  Additionally, you can check the results online.  You should not have any Tylenol or alcohol until we know the results.  This blood work needs to be repeated in about a week.  Return to emergency department for any abdominal pain, nausea/vomiting or any other worsening or concerning symptoms.

## 2018-11-29 NOTE — ED Notes (Signed)
Patient verbalizes understanding of discharge instructions. Opportunity for questioning and answers were provided. Armband removed by staff, pt discharged from ED.  

## 2018-11-29 NOTE — ED Provider Notes (Signed)
[facility] EMERGENCY DEPARTMENT Provider Note   CSN: [id number] Arrival date & time: 11/29/18  1804    History   Chief Complaint No chief complaint on file.   HPI Tracy Stout is a 23 y.o. female presents for evaluation of abnormal labs.  Patient reports that she recently started following up with the methadone clinic and stated that she had blood work drawn 11/27/18.  Her AST was 113 and ALT was 435.  She just received the blood work today and was told to go to the emergency department for further evaluation.  Patient reports that she has had history of drug use but states she never had any IV drug use.  She states she last used heroin a few weeks ago prior to entering the methadone clinic.  She is not used any drugs since.  She denies any alcohol use.  She does not use frequent Tylenol.  Patient denies any fevers, abdominal pain, nausea/vomiting.     The history is provided by the patient.    Past Medical History:  Diagnosis Date  . ADHD (attention deficit hyperactivity disorder)   . Anxiety   . Bipolar 1 disorder (HCC)   . Depression   . Pneumonia   . Polysubstance abuse (HCC)   . PTSD (post-traumatic stress disorder)   . Supervision of normal pregnancy 06/30/2016    [facility] Init[facility] Stout at   6+2 weeks FOB  [name] 23 yo bm 4th Dating By  [name]  Pap   NA GC/CT Initial:                36+wks: Genetic Screen NT/IT:  CF screen  Anatomic [name]  Flu vaccine  Tdap Recommended ~ 28wks Glucose Screen  2 hr GBS  Feed Preference  Contraception  Circumcision  Childbirth Classes  Pediatrician    . UTI (urinary tract infection) during pregnancy 09/2016    Patient Active Problem List   Diagnosis Date Noted  . H/O Deliberate self-cutting 03/21/2017  . Drug use 11/07/2016  . Anemia   . History of pyelonephritis during pregnancy 10/06/2016  . 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 Generalized anxiety disorder 10/25/2014  . MDD (major depressive disorder), recurrent  severe, without psychosis (HCC) 07/23/2013  . 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 tubes in ears       OB History    Gravida  1   Para  1   Term  1   Preterm      AB      Living  1     SAB      TAB      Ectopic      Multiple  0   Live Births  1            Home Medications    Prior to Admission medications   Medication Sig Start Date End Date Taking? Authorizing Provider  acetaminophen (TYLENOL) 500 MG tablet Take 1,000 mg by mouth every 6 (six) hours as needed for mild pain.    [provider]  citalopram (CELEXA) 40 MG tablet Take 1 tablet (40 mg total) by mouth daily. 03/31/17   Cheral Marker, CNM  guanFACINE (INTUNIV) 1 MG TB24 ER tablet Take 1 mg by mouth every morning. 04/11/18   [provider]  ibuprofen (ADVIL,MOTRIN) 200 MG tablet Take 400 mg by mouth every 6 (six) hours as needed for  headache.    [provider]  permethrin (ELIMITE) 5 % cream Apply to affected area once 08/12/18   McDonald, Mia A, PA-C  SUBOXONE 8-2 MG FILM Place 1 Film under the tongue daily. 04/18/18   [provider]  traZODone (DESYREL) 50 MG tablet Take 50 mg by mouth at bedtime as needed. 04/11/18   [provider]  VRAYLAR capsule Take 73 mg by mouth every morning. 04/11/18   [provider]    Family History Family History  Problem Relation Age of Onset  . Hypertension Paternal Grandmother   . Bipolar disorder Paternal Grandmother   . Depression Maternal Grandmother   . Stroke Maternal Grandfather        X 5  . Bipolar disorder Father   . ADD / ADHD Father   . Hypertension Father   . Drug abuse Father        drug overdose  . Depression Father   . Depression Mother   . ADD / ADHD Brother   . Bipolar disorder Sister   . Depression Sister   . ADD / ADHD Sister   . Other Sister        heart condition; had open heart  surgery    Social History Social History   Tobacco Use  . Smoking status: Current Every Day Smoker    Packs/day: 1.00    Years: 3.00    Pack years: 3.00    Types: Cigarettes  . Smokeless tobacco: Never Used  Substance Use Topics  . Alcohol use: No  . Drug use: Yes    Types: Marijuana, Cocaine, Benzodiazepines, Other-see comments, IV    Comment: THC, heroin (snorted), and xanax OD 10/28/2018     Allergies   Patient has no known allergies.   Review of Systems Review of Systems  Constitutional: Negative for fever.  Gastrointestinal: Negative for abdominal pain, nausea and vomiting.  All other systems reviewed and are negative.    Physical Exam Updated Vital Signs BP (!) 97/56 (BP Location: Right Arm)   Pulse (!) 56   Temp 98 F (36.7 C) (Oral)   Resp 18   SpO2 99%   Physical Exam Vitals signs and nursing note reviewed.  Constitutional:      Appearance: Normal appearance. She is well-developed.  HENT:     Head: Normocephalic and atraumatic.  Eyes:     General: Lids are normal.     Conjunctiva/sclera: Conjunctivae normal.     Pupils: Pupils are equal, round, and reactive to light.  Neck:     Musculoskeletal: Full passive range of motion without pain.  Cardiovascular:     Rate and Rhythm: Normal rate and regular rhythm.     Pulses: Normal pulses.     Heart sounds: Normal heart sounds. No murmur. No friction rub. No gallop.   Pulmonary:     Effort: Pulmonary effort is normal.     Breath sounds: Normal breath sounds.  Abdominal:     Palpations: Abdomen is soft. Abdomen is not rigid.     Tenderness: There is no abdominal tenderness. There is no guarding.     Comments: Abdomen is soft, non-distended, non-tender. No rigidity, No guarding. No peritoneal signs.  Musculoskeletal: Normal range of motion.  Skin:    General: Skin is warm and dry.     Capillary Refill: Capillary refill takes less than 2 seconds.  Neurological:     Mental Status: She is alert and  oriented to person, place, and time.  Psychiatric:        Speech: Speech normal.      ED Treatments / Results  Labs (all labs ordered are listed, but only abnormal results are displayed) Labs Reviewed  COMPREHENSIVE METABOLIC PANEL - Abnormal; Notable for the following components:      Result Value   Calcium 8.5 (*)    AST 50 (*)    ALT 181 (*)    All other components within normal limits  CBC  HEPATITIS PANEL, ACUTE    EKG None  Radiology No results found.  Procedures Procedures (including critical Stout time)  Medications Ordered in ED Medications - No data to display   Initial Impression / Assessment and Plan / ED Course  I have reviewed the triage vital signs and the nursing notes.  Pertinent labs & imaging results that were available during my Stout of the patient were reviewed by me and considered in my medical decision making (see chart for details).        [age] year old female who presents for evaluation of abnormal labs.  Patient reports she recently entered a methadone clinic and had a basic blood work drawn.  Patient reports that she was called today and told that her liver enzymes were abnormal and was told to follow-up.  Patient states that she does not have a primary Stout doctor.  Patient denies any abdominal pain, nausea/vomiting.  Patient states she has a history of heroin use but states she never did any IV drugs.  Patient states that she does not drink any alcohol.  Patient states she has not had any symptoms. Patient is afebrile, non-toxic appearing, sitting comfortably on examination table. Vital signs reviewed and stable.  On exam, abdomen is benign with no evidence of tenderness.  Labs ordered at triage.  Will add hepatitis panel.  CBC unremarkable.  CMP shows AST of 50, ALT of 181.  This is a decline from previous blood work done on 11/27/18.   Discussed with case manager who arranged outpatient follow-up with [facility] for results of  hepatitis panel.  At this time, patient has no abdominal pain, nausea/vomiting.  Her abdominal exam is benign.  At this time, do not feel that patient needs any acute emergent imaging. Discussed patient with Dr. Jacqulyn BathLong agrees with plan. At this time, patient exhibits no emergent life-threatening condition that require further evaluation in ED or admission. Patient had ample opportunity for questions and discussion. All patient's questions were answered with full understanding. Strict return precautions discussed. Patient expresses understanding and agreement to plan.   Portions of this note were generated with Scientist, clinical (histocompatibility and immunogenetics)Dragon dictation software. Dictation errors may occur despite best attempts at proofreading.  Final Clinical Impressions(s) / ED Diagnoses   Final diagnoses:  Elevated liver enzymes    ED Discharge Orders    None       Maxwell CaulLayden, Makaylia Hewett A, PA-C 11/29/18 2319    Maia PlanLong, Joshua G, MD 11/30/18 1118

## 2018-11-30 LAB — HEPATITIS PANEL, ACUTE
HCV Ab: >11 s/co ratio — ABNORMAL HIGH (ref 0.0–0.9)
HEP A IGM: NEGATIVE
Hep B C IgM: NEGATIVE
Hepatitis B Surface Ag: NEGATIVE

## 2018-12-03 ENCOUNTER — Ambulatory Visit (INDEPENDENT_AMBULATORY_CARE_PROVIDER_SITE_OTHER): Payer: Medicaid Other | Admitting: Family Medicine

## 2018-12-03 ENCOUNTER — Encounter: Payer: Self-pay | Admitting: Family Medicine

## 2018-12-03 VITALS — BP 100/64 | HR 90 | Temp 98.0°F | Ht 67.0 in | Wt 136.0 lb

## 2018-12-03 DIAGNOSIS — F419 Anxiety disorder, unspecified: Secondary | ICD-10-CM

## 2018-12-03 DIAGNOSIS — F411 Generalized anxiety disorder: Secondary | ICD-10-CM

## 2018-12-03 DIAGNOSIS — Z7689 Persons encountering health services in other specified circumstances: Secondary | ICD-10-CM | POA: Diagnosis not present

## 2018-12-03 DIAGNOSIS — B1710 Acute hepatitis C without hepatic coma: Secondary | ICD-10-CM

## 2018-12-03 DIAGNOSIS — Z09 Encounter for follow-up examination after completed treatment for conditions other than malignant neoplasm: Secondary | ICD-10-CM | POA: Diagnosis not present

## 2018-12-03 DIAGNOSIS — F332 Major depressive disorder, recurrent severe without psychotic features: Secondary | ICD-10-CM

## 2018-12-03 LAB — POCT URINALYSIS DIP (MANUAL ENTRY)
Bilirubin, UA: NEGATIVE
Blood, UA: NEGATIVE
Glucose, UA: NEGATIVE mg/dL
Ketones, POC UA: NEGATIVE mg/dL
Nitrite, UA: NEGATIVE
Protein Ur, POC: NEGATIVE mg/dL
Spec Grav, UA: 1.02 (ref 1.010–1.025)
Urobilinogen, UA: 2 E.U./dL — AB
pH, UA: 7 (ref 5.0–8.0)

## 2018-12-03 NOTE — Progress Notes (Signed)
[facility]   New Patient--Hospital Follow Up--Establish Care  Subjective:  Patient ID: Tracy Stout, female    DOB: 05-07-1996  Age: 23 y.o. MRN: [id number]  CC:  Chief Complaint  Patient presents with  . Establish Care  . Hospitalization Follow-up  . Hepatitis C    HPI Tracy Stout is a 23 year old who presents for Hospital Follow Up and to Establish Care today.   Past Medical History:  Diagnosis Date  . ADHD (attention deficit hyperactivity disorder)   . Anxiety   . Bipolar 1 disorder (HCC)   . Depression   . Pneumonia   . Polysubstance abuse (HCC)   . PTSD (post-traumatic stress disorder)   . Supervision of normal pregnancy 06/30/2016    [facility] Care at   6+2 weeks FOB  [name] 23 yo bm 4th Dating By  [name]  Pap   NA GC/CT Initial:                36+wks: Genetic Screen NT/IT:  CF screen  Anatomic [name]  Flu vaccine  Tdap Recommended ~ 28wks Glucose Screen  2 hr GBS  Feed Preference  Contraception  Circumcision  Childbirth Classes  Pediatrician    . UTI (urinary tract infection) during pregnancy 09/2016    Current Status: Since her last office visit, she states that she is very tired today. She is currently taking Methadone for her drug history of Hepatitis, Cocaine, and Fentanyl. Her father, and auntie died of a drug overdose. Her anxiety is very high today. She has had self-inflicted wounds. She has been homeless off and on. she is supported by her mother today. She denies suicidal ideations, homicidal ideations, or auditory hallucinations. She is currently been diagnosed Hepatitis C, and she will be following up with Infection Disease, with Infection Disease.   She denies fevers, chills, fatigue, recent infections, weight loss, and night sweats. She has not had any headaches, visual changes, dizziness, and falls. No chest pain, heart palpitations, cough and shortness of breath reported. No reports of GI  problems such as nausea, vomiting, diarrhea, and constipation. She has no reports of blood in stools, dysuria and hematuria. She denies pain today. She is accompanied by her mother today.   Past Surgical History:  Procedure Laterality Date  . tubes in ears      Family History  Problem Relation Age of Onset  . Hypertension Paternal Grandmother   . Bipolar disorder Paternal Grandmother   . Depression Maternal Grandmother   . Stroke Maternal Grandfather        X 5  . Bipolar disorder Father   . ADD / ADHD Father   . Hypertension Father   . Drug abuse Father        drug overdose  . Depression Father   . Depression Mother   . ADD / ADHD Brother   . Bipolar disorder Sister   . Depression Sister   . ADD / ADHD Sister   . Other Sister        heart condition; had open heart surgery    Social History   Socioeconomic History  . Marital status: Single    Spouse name: Not on file  . Number of children: Not on file  . Years of education: Not on file  . Highest education level: Not on file  Occupational History  . Not on file  Social Needs  . Financial resource strain: Not on  file  . Food insecurity:    Worry: Not on file    Inability: Not on file  . Transportation needs:    Medical: Not on file    Non-medical: Not on file  Tobacco Use  . Smoking status: Current Every Day Smoker    Packs/day: 1.00    Years: 3.00    Pack years: 3.00    Types: Cigarettes  . Smokeless tobacco: Never Used  Substance and Sexual Activity  . Alcohol use: No  . Drug use: Yes    Types: Marijuana, Cocaine, Benzodiazepines, Other-see comments, IV    Comment: THC, heroin (snorted), and xanax OD 10/28/2018  . Sexual activity: Yes    Birth control/protection: None  Lifestyle  . Physical activity:    Days per week: Not on file    Minutes per session: Not on file  . Stress: Not on file  Relationships  . Social connections:    Talks on phone: Not on file    Gets together: Not on file    Attends  religious service: Not on file    Active member of club or organization: Not on file    Attends meetings of clubs or organizations: Not on file    Relationship status: Not on file  . Intimate partner violence:    Fear of current or ex partner: Not on file    Emotionally abused: Not on file    Physically abused: Not on file    Forced sexual activity: Not on file  Other Topics Concern  . Not on file  Social History Narrative  . Not on file    Outpatient Medications Prior to Visit  Medication Sig Dispense Refill  . citalopram (CELEXA) 40 MG tablet Take 1 tablet (40 mg total) by mouth daily. (Patient not taking: Reported on 12/03/2018) 30 tablet 6  . guanFACINE (INTUNIV) 1 MG TB24 ER tablet Take 1 mg by mouth every morning.  1  . ibuprofen (ADVIL,MOTRIN) 200 MG tablet Take 400 mg by mouth every 6 (six) hours as needed for headache.    . SUBOXONE 8-2 MG FILM Place 1 Film under the tongue daily.  0  . traZODone (DESYREL) 50 MG tablet Take 50 mg by mouth at bedtime as needed.  1  . VRAYLAR capsule Take 73 mg by mouth every morning.  1  . acetaminophen (TYLENOL) 500 MG tablet Take 1,000 mg by mouth every 6 (six) hours as needed for mild pain.    Marland Kitchen permethrin (ELIMITE) 5 % cream Apply to affected area once (Patient not taking: Reported on 12/03/2018) 60 g 0   No facility-administered medications prior to visit.     No Known Allergies  ROS Review of Systems  Constitutional:       Appears lethargic intermittently.  HENT: Negative.   Eyes: Negative.   Respiratory: Negative.   Cardiovascular: Negative.   Gastrointestinal: Negative.   Endocrine: Negative.   Genitourinary: Negative.   Musculoskeletal: Negative.   Skin: Negative.   Allergic/Immunologic: Negative.   Neurological: Negative.   Hematological: Negative.   Psychiatric/Behavioral: The patient is nervous/anxious.        Moderate anxiety today.   Objective:    Physical Exam  Constitutional: She is oriented to person, place,  and time. She appears well-developed and well-nourished.  HENT:  Head: Normocephalic and atraumatic.  Eyes: Conjunctivae are normal.  Neck: Normal range of motion. Neck supple.  Cardiovascular: Normal rate, regular rhythm and normal heart sounds.  Pulmonary/Chest: Effort normal and breath  sounds normal.  Abdominal: Soft. Bowel sounds are normal.  Musculoskeletal: Normal range of motion.  Neurological: She is alert and oriented to person, place, and time. She has normal reflexes.  Skin: Skin is warm and dry.  Psychiatric: She has a normal mood and affect. Her behavior is normal. Judgment and thought content normal.  Nursing note and vitals reviewed.   BP 100/64 (BP Location: Left Arm, Patient Position: Sitting, Cuff Size: Small)   Pulse 90   Temp 98 F (36.7 C) (Oral)   Ht 5\' 7"  (1.702 m)   Wt 136 lb (61.7 kg)   LMP 09/03/2018   SpO2 98%   BMI 21.30 kg/m  Wt Readings from Last 3 Encounters:  12/03/18 136 lb (61.7 kg)  10/28/18 130 lb (59 kg)  08/12/18 135 lb (61.2 kg)     Health Maintenance Due  Topic Date Due  . CHLAMYDIA SCREENING  10/27/2015    There are no preventive care reminders to display for this patient.  Lab Results  Component Value Date   TSH 1.867 10/07/2016   Lab Results  Component Value Date   WBC 5.4 11/29/2018   HGB 13.5 11/29/2018   HCT 43.2 11/29/2018   MCV 93.3 11/29/2018   PLT 164 11/29/2018   Lab Results  Component Value Date   NA 137 11/29/2018   K 4.2 11/29/2018   CO2 29 11/29/2018   GLUCOSE 80 11/29/2018   BUN 11 11/29/2018   CREATININE 1.00 11/29/2018   BILITOT 0.5 11/29/2018   ALKPHOS 123 11/29/2018   AST 50 (H) 11/29/2018   ALT 181 (H) 11/29/2018   PROT 6.7 11/29/2018   ALBUMIN 3.7 11/29/2018   CALCIUM 8.5 (L) 11/29/2018   ANIONGAP 7 11/29/2018   Lab Results  Component Value Date   CHOL 139 07/24/2013   Lab Results  Component Value Date   HDL 39 07/24/2013   Lab Results  Component Value Date   LDLCALC 80  07/24/2013   Lab Results  Component Value Date   TRIG 100 07/24/2013   Lab Results  Component Value Date   CHOLHDL 3.6 07/24/2013   No results found for: HGBA1C    Assessment & Plan:   1. Hospital discharge follow-up  2. Encounter to establish care - POCT urinalysis dipstick  3. Anxiety Anxiety is moderated toy. Patient refuses anxiety medication at this time. We will continue to monitor.  - Ambulatory referral to Psychiatry  4. Generalized anxiety disorder - Ambulatory referral to Psychiatry  5. MDD (major depressive disorder), recurrent severe, without psychosis (HCC) - Ambulatory referral to Psychiatry  6. Acute hepatitis C virus infection without hepatic coma - Ambulatory referral to Infectious Disease  7. Follow up She will follow up in 1 month.   No orders of the defined types were placed in this encounter.  Orders Placed This Encounter  Procedures  . Ambulatory referral to Infectious Disease  . Ambulatory referral to Psychiatry  . POCT urinalysis dipstick     Referral Orders     Ambulatory referral to Infectious Disease     Ambulatory referral to Psychiatry   Raliegh Ip,  MSN, FNP-C [facility] [address] [place], [place] [address] [phone]   Problem List Items Addressed This Visit    None    Visit Diagnoses    Encounter to establish care    -  Primary   Relevant Orders   POCT urinalysis dipstick      No orders  of the defined types were placed in this encounter.   Follow-up: No follow-ups on file.    Kallie Locks, FNP

## 2018-12-07 ENCOUNTER — Other Ambulatory Visit: Payer: Self-pay | Admitting: Family Medicine

## 2018-12-07 DIAGNOSIS — N390 Urinary tract infection, site not specified: Secondary | ICD-10-CM

## 2018-12-07 MED ORDER — SULFAMETHOXAZOLE-TRIMETHOPRIM 800-160 MG PO TABS: 1.0000 | ORAL_TABLET | Freq: Two times a day (BID) | ORAL | 0 refills | Status: DC

## 2018-12-12 ENCOUNTER — Other Ambulatory Visit: Payer: Self-pay

## 2018-12-12 ENCOUNTER — Encounter: Payer: Self-pay | Admitting: Family

## 2018-12-12 ENCOUNTER — Ambulatory Visit (INDEPENDENT_AMBULATORY_CARE_PROVIDER_SITE_OTHER): Payer: Medicaid Other | Admitting: Family

## 2018-12-12 ENCOUNTER — Telehealth: Payer: Self-pay | Admitting: Pharmacy Technician

## 2018-12-12 VITALS — BP 98/59 | HR 66 | Temp 98.6°F | Wt 137.0 lb

## 2018-12-12 DIAGNOSIS — R768 Other specified abnormal immunological findings in serum: Secondary | ICD-10-CM

## 2018-12-12 DIAGNOSIS — B182 Chronic viral hepatitis C: Secondary | ICD-10-CM | POA: Insufficient documentation
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
## 2018-12-12 NOTE — Patient Instructions (Signed)
Nice to meet you.  We will check your blood work today and be in touch with results and plan of care.  Limit acetaminophen (Tylenol) usage to no more than 2 grams (2,000 mg) per day.  Avoid alcohol.  Do not share toothbrushes or razors.  Practice safe sex to protect against transmission as well as sexually transmitted disease.    Hepatitis C Hepatitis C is a viral infection of the liver. It can lead to scarring of the liver (cirrhosis), liver failure, or liver cancer. Hepatitis C may go undetected for months or years because people with the infection may not have symptoms, or they may have only mild symptoms. What are the causes? This condition is caused by the hepatitis C virus (HCV). The virus can spread from person to person (is contagious) through:  Blood.  Childbirth. A woman who has hepatitis C can pass it to her baby during birth.  Bodily fluids, such as breast milk, tears, semen, vaginal fluids, and saliva.  Blood transfusions or organ transplants done in the [place] before 1992.  What increases the risk? The following factors may make you more likely to develop this condition:  Having contact with unclean (contaminated) needles or syringes. This may result from: ? Acupuncture. ? Tattoing. ? Body piercing. ? Injecting drugs.  Having unprotected sex with someone who is infected.  Needing treatment to filter your blood (kidney dialysis).  Having HIV (human immunodeficiency virus) or AIDS (acquired immunodeficiency syndrome).  Working in a job that involves contact with blood or bodily fluids, such as health care.  What are the signs or symptoms? Symptoms of this condition include:  Fatigue.  Loss of appetite.  Nausea.  Vomiting.  Abdominal pain.  Dark yellow urine.  Yellowish skin and eyes (jaundice).  Itchy skin.  Clay-colored bowel movements.  Joint pain.  Bleeding and bruising easily.  Fluid building up in your stomach (ascites).   In some cases, you may not have any symptoms. How is this diagnosed? This condition is diagnosed with:  Blood tests.  Other tests to check how well your liver is functioning. They may include: ? Magnetic resonance elastography (MRE). This imaging test uses MRIs and sound waves to measure liver stiffness. ? Transient elastography. This imaging test uses ultrasounds to measure liver stiffness. ? Liver biopsy. This test requires taking a small tissue sample from your liver to examine it under a microscope.  How is this treated? Your health care provider may perform noninvasive tests or a liver biopsy to help decide the best course of treatment. Treatment may include:  Antiviral medicines and other medicines.  Follow-up treatments every 6-12 months for infections or other liver conditions.  Receiving a donated liver (liver transplant).  Follow these instructions at home: Medicines  Take over-the-counter and prescription medicines only as told by your health care provider.  Take your antiviral medicine as told by your health care provider. Do not stop taking the antiviral even if you start to feel better.  Do not take any medicines unless approved by your health care provider, including over-the-counter medicines and birth control pills. Activity  Rest as needed.  Do not have sex unless approved by your health care provider.  Ask your health care provider when you may return to school or work. Eating and drinking  Eat a balanced diet with plenty of fruits and vegetables, whole grains, and lowfat (lean) meats or non-meat proteins (such as beans or tofu).  Drink enough fluids to keep your urine clear  or pale yellow.  Do not drink alcohol. General instructions  Do not share toothbrushes, nail clippers, or razors.  Wash your hands frequently with soap and water. If soap and water are not available, use hand sanitizer.  Cover any cuts or open sores on your skin to prevent  spreading the virus.  Keep all follow-up visits as told by your health care provider. This is important. You may need follow-up visits every 6-12 months. How is this prevented? There is no vaccine for hepatitis C. The only way to prevent the disease is to reduce the risk of exposure to the virus. Make sure you:  Wash your hands frequently with soap and water. If soap and water are not available, use hand sanitizer.  Do not share needles or syringes.  Practice safe sex and use condoms.  Avoid handling blood or bodily fluids without gloves or other protection.  Avoid getting tattoos or piercings in shops or other locations that are not clean.  Contact a health care provider if:  You have a fever.  You develop abdominal pain.  You pass dark urine.  You pass clay-colored stools.  You develop joint pain. Get help right away if:  You have increasing fatigue or weakness.  You lose your appetite.  You cannot eat or drink without vomiting.  You develop jaundice or your jaundice gets worse.  You bruise or bleed easily. Summary  Hepatitis C is a viral infection of the liver. It can lead to scarring of the liver (cirrhosis), liver failure, or liver cancer.  The hepatitis C virus (HCV) causes this condition. The virus can pass from person to person (is contagious).  You should not take any medicines unless approved by your health care provider. This includes over-the-counter medicines and birth control pills. This information is not intended to replace advice given to you by your health care provider. Make sure you discuss any questions you have with your health care provider. Document Released: 09/09/2000 Document Revised: 10/18/2016 Document Reviewed: 10/18/2016 Elsevier Interactive Patient Education  [employer].

## 2018-12-12 NOTE — Telephone Encounter (Signed)
[facility] Patient Product/process development scientist completed.    The patient in insured through [facility] and has a $0 copay.  To be approved for a Hepatitis C medication, she will need to sign a readiness form.   Netty Starring. Dimas Aguas CPhT Specialty Pharmacy Patient [facility] for Infectious Disease Phone: [phone] Fax:  [phone]

## 2018-12-12 NOTE — Assessment & Plan Note (Signed)
Ms. Haslem has a positive hepatitis C antibody test with risk factors for hepatitis C including sharing of razors and tattoos.  Discussed the pathogenesis, risk of progression, and treatments associated with hepatitis C.  Check hepatitis C blood work today.  She met with our pharmacy staff for financial assistance.  Plan for treatment pending blood work results if necessary.  She does remain high risk given previous history of mental health issues combined with history of polysubstance abuse.

## 2018-12-19 LAB — LIVER FIBROSIS, FIBROTEST-ACTITEST
ALT: 112 U/L — ABNORMAL HIGH (ref 6–29)
Alpha-2-Macroglobulin: 250 mg/dL (ref 106–279)
Apolipoprotein A1: 93 mg/dL — ABNORMAL LOW (ref 101–198)
Bilirubin: 0.6 mg/dL (ref 0.2–1.2)
Fibrosis Score: 0.5
GGT: 43 U/L — ABNORMAL HIGH (ref 3–40)
Haptoglobin: 23 mg/dL — ABNORMAL LOW (ref 43–212)
Necroinflammat ACT Score: 0.68
Reference ID: 2906399

## 2018-12-19 LAB — COMPREHENSIVE METABOLIC PANEL
AG Ratio: 1.6 (calc) (ref 1.0–2.5)
ALT: 113 U/L — ABNORMAL HIGH (ref 6–29)
AST: 97 U/L — ABNORMAL HIGH (ref 10–30)
Albumin: 4 g/dL (ref 3.6–5.1)
Alkaline phosphatase (APISO): 107 U/L (ref 31–125)
BUN: 12 mg/dL (ref 7–25)
CO2: 31 mmol/L (ref 20–32)
CREATININE: 0.88 mg/dL (ref 0.50–1.10)
Calcium: 9 mg/dL (ref 8.6–10.2)
Chloride: 103 mmol/L (ref 98–110)
GLUCOSE: 94 mg/dL (ref 65–99)
Globulin: 2.5 g/dL (calc) (ref 1.9–3.7)
Potassium: 4.3 mmol/L (ref 3.5–5.3)
Sodium: 139 mmol/L (ref 135–146)
Total Bilirubin: 0.6 mg/dL (ref 0.2–1.2)
Total Protein: 6.5 g/dL (ref 6.1–8.1)

## 2018-12-19 LAB — HEPATITIS C RNA QUANTITATIVE
HCV Quantitative Log: 5.64 Log IU/mL — ABNORMAL HIGH
HCV RNA, PCR, QN: 441000 [IU]/mL — AB

## 2018-12-19 LAB — CBC
HCT: 38.3 % (ref 35.0–45.0)
Hemoglobin: 13.1 g/dL (ref 11.7–15.5)
MCH: 30.4 pg (ref 27.0–33.0)
MCHC: 34.2 g/dL (ref 32.0–36.0)
MCV: 88.9 fL (ref 80.0–100.0)
MPV: 12.8 fL — ABNORMAL HIGH (ref 7.5–12.5)
Platelets: 170 10*3/uL (ref 140–400)
RBC: 4.31 10*6/uL (ref 3.80–5.10)
RDW: 12.2 % (ref 11.0–15.0)
WBC: 4 10*3/uL (ref 3.8–10.8)

## 2018-12-19 LAB — HEPATITIS B SURFACE ANTIBODY,QUALITATIVE: Hep B S Ab: NONREACTIVE

## 2018-12-19 LAB — HEPATITIS C GENOTYPE: HCV Genotype: 3

## 2018-12-24 ENCOUNTER — Telehealth: Payer: Self-pay | Admitting: Family

## 2018-12-24 DIAGNOSIS — B182 Chronic viral hepatitis C: Secondary | ICD-10-CM

## 2018-12-24 MED ORDER — GLECAPREVIR-PIBRENTASVIR 100-40 MG PO TABS: 3.0000 | ORAL_TABLET | Freq: Every day | ORAL | 1 refills | Status: DC

## 2018-12-24 NOTE — Telephone Encounter (Signed)
Tracy Stout has Genotype 3 Hepatitis C with a viral load of 441,000 and a fibrosis score of F2. Will plan for 8 weeks of Mavyret and follow up with pharmacy 1 month after start of treatment.

## 2018-12-25 ENCOUNTER — Telehealth: Payer: Self-pay | Admitting: Pharmacy Technician

## 2018-12-25 ENCOUNTER — Other Ambulatory Visit: Payer: Self-pay | Admitting: Pharmacist

## 2018-12-25 ENCOUNTER — Telehealth: Payer: Self-pay | Admitting: Pharmacist

## 2018-12-25 ENCOUNTER — Encounter: Payer: Self-pay | Admitting: Pharmacy Technician

## 2018-12-25 DIAGNOSIS — B182 Chronic viral hepatitis C: Secondary | ICD-10-CM

## 2018-12-25 MED ORDER — GLECAPREVIR-PIBRENTASVIR 100-40 MG PO TABS: 3.0000 | ORAL_TABLET | Freq: Every day | ORAL | 1 refills | Status: DC

## 2018-12-25 NOTE — Progress Notes (Signed)
Correcting prescription to say take with food.

## 2018-12-25 NOTE — Telephone Encounter (Signed)
Patient is approved to receive Mavyret x 8 weeks for chronic Hepatitis C infection. Counseled patient to take all three tablets of Mavyret daily with food.  Counseled patient the need to take all three tablets together and to not separate them out during the day. Encouraged patient not to miss any doses and explained how their chance of cure could go down with each dose missed. Counseled patient on what to do if dose is missed - if it is closer to the missed dose take immediately; if closer to next dose then skip dose and take the next dose at the usual time.   Counseled patient on common side effects such as headache, fatigue, and nausea and that these normally decrease with time. I reviewed patient medications and found no drug interactions. Discussed with patient that there are several drug interactions with Mavyret and instructed patient to call the clinic if she wishes to start a new medication during course of therapy. Also advised patient to call if she experiences any side effects. Patient will follow-up with me in the pharmacy clinic on 5/8.  I primarily spoke with her mother, Foye Clock, on the phone.

## 2018-12-25 NOTE — Telephone Encounter (Signed)
[facility] Patient Advocate Encounter  Prior Authorization for Mavyret has been approved.    PA# [id number] w Effective dates: 12/25/2018 through 02/24/2019  Patients co-pay is $3.00.   [facility] Clinic will continue to follow.  Netty Starring. Dimas Aguas CPhT Specialty Pharmacy Patient [facility] for Infectious Disease Phone: [phone] Fax:  [phone]

## 2018-12-25 NOTE — Telephone Encounter (Signed)
Great, thanks

## 2018-12-25 NOTE — Telephone Encounter (Signed)
[facility] Patient Advocate Encounter   Received notification from [facility] that prior authorization for Mavyret is required.   PA submitted on 12/25/2018 Key [id number] w Status is pending    [facility] Clinic will continue to follow.   Netty Starring. Dimas Aguas CPhT Specialty Pharmacy Patient [facility] for Infectious Disease Phone: [phone] Fax:  [phone]

## 2018-12-26 MED FILL — MAVYRET 100-40 MG TABS: 100-40 | 28 days supply | Qty: 84 | Fill #0

## 2019-01-02 ENCOUNTER — Ambulatory Visit: Payer: Medicaid Other | Admitting: Family Medicine

## 2019-01-04 ENCOUNTER — Ambulatory Visit: Payer: Medicaid Other | Admitting: Family Medicine

## 2019-01-08 ENCOUNTER — Encounter: Payer: Self-pay | Admitting: Family Medicine

## 2019-01-08 ENCOUNTER — Other Ambulatory Visit: Payer: Self-pay

## 2019-01-08 ENCOUNTER — Ambulatory Visit (INDEPENDENT_AMBULATORY_CARE_PROVIDER_SITE_OTHER): Payer: Medicaid Other | Admitting: Family Medicine

## 2019-01-08 VITALS — BP 102/60 | HR 98 | Temp 97.9°F | Ht 67.0 in | Wt 138.0 lb

## 2019-01-08 DIAGNOSIS — R350 Frequency of micturition: Secondary | ICD-10-CM | POA: Diagnosis not present

## 2019-01-08 DIAGNOSIS — R8290 Unspecified abnormal findings in urine: Secondary | ICD-10-CM

## 2019-01-08 DIAGNOSIS — N926 Irregular menstruation, unspecified: Secondary | ICD-10-CM

## 2019-01-08 DIAGNOSIS — Z09 Encounter for follow-up examination after completed treatment for conditions other than malignant neoplasm: Secondary | ICD-10-CM | POA: Diagnosis not present

## 2019-01-08 DIAGNOSIS — F419 Anxiety disorder, unspecified: Secondary | ICD-10-CM | POA: Diagnosis not present

## 2019-01-08 LAB — POCT URINALYSIS DIP (MANUAL ENTRY)
Bilirubin, UA: NEGATIVE
Blood, UA: NEGATIVE
Glucose, UA: NEGATIVE mg/dL
Ketones, POC UA: NEGATIVE mg/dL
Nitrite, UA: NEGATIVE
Protein Ur, POC: NEGATIVE mg/dL
Spec Grav, UA: 1.015 (ref 1.010–1.025)
Urobilinogen, UA: 1 E.U./dL
pH, UA: 7.5 (ref 5.0–8.0)

## 2019-01-08 LAB — POCT URINE PREGNANCY: Preg Test, Ur: NEGATIVE

## 2019-01-08 NOTE — Progress Notes (Signed)
[facility]   Established Patient Office Visit  Subjective:  Patient ID: Tracy Stout, female    DOB: 02/23/1996  Age: 23 y.o. MRN: [id number]  CC:  Chief Complaint  Patient presents with  . Urinary Frequency  . Urinary Incontinence  . Menstrual Problem    HPI Tracy Stout is a 23 year old female who presents for follow up today.   Past Medical History:  Diagnosis Date  . ADHD (attention deficit hyperactivity disorder)   . Anxiety   . Bipolar 1 disorder (HCC)   . Borderline personality disorder (HCC)   . Depression   . Pneumonia   . Polysubstance abuse (HCC)   . PTSD (post-traumatic stress disorder)   . PTSD (post-traumatic stress disorder)   . Self-inflicted injury   . Suicide attempt (HCC)   . Supervision of normal pregnancy 06/30/2016    [facility] at   6+2 weeks FOB  [place] 23 yo bm 4th Dating By  US  Pap   NA GC/CT Initial:                36+wks: Genetic Screen NT/IT:  CF screen  Anatomic US  Flu vaccine  Tdap Recommended ~ 28wks Glucose Screen  2 hr GBS  Feed Preference  Contraception  Circumcision  Childbirth Classes  Pediatrician    . UTI (urinary tract infection) during pregnancy 09/2016   Current Status: Since her last office visit, she is doing well with c/o urinary frequency X 1 week, and occasional incontinence. She denies discharge, dysuria, urinary itching, burning, odor, hematuria, and suprapubic pain/discomfort. She states that she has recently been drinking more water. She continues to follow up with [facility] on a daily basis, for Methodone elixir. She states that she has now tested negative on all of her drug screenings there and will soon be able to take medication in a pill form at home. She has not had menstrual period in 5 months, but plans to follow up with her GYN soon. She states that Psychiatrist referral states that they are not currently seeing any patients at  this time. Her anxiety is stable and much better today. She denies suicidal ideations, homicidal ideations, or auditory hallucinations.  She denies fevers, chills, fatigue, recent infections, weight loss, and night sweats. She has not had any headaches, visual changes, dizziness, and falls. No chest pain, heart palpitations, cough and shortness of breath reported. No reports of GI problems such as nausea, vomiting, diarrhea, and constipation. She has no reports of blood in stools, dysuria and hematuria. She denies pain today.   Past Surgical History:  Procedure Laterality Date  . tubes in ears      Family History  Problem Relation Age of Onset  . Hypertension Paternal Grandmother   . Bipolar disorder Paternal Grandmother   . Depression Maternal Grandmother   . Stroke Maternal Grandfather        X 5  . Bipolar disorder Father   . ADD / ADHD Father   . Hypertension Father   . Drug abuse Father        drug overdose  . Depression Father   . Depression Mother   . ADD / ADHD Brother   . Bipolar disorder Sister   . Depression Sister   . ADD / ADHD Sister   . Other Sister        heart condition; had open heart surgery    Social  History   Socioeconomic History  . Marital status: Single    Spouse name: Not on file  . Number of children: Not on file  . Years of education: Not on file  . Highest education level: Not on file  Occupational History  . Not on file  Social Needs  . Financial resource strain: Not on file  . Food insecurity:    Worry: Not on file    Inability: Not on file  . Transportation needs:    Medical: Not on file    Non-medical: Not on file  Tobacco Use  . Smoking status: Current Every Day Smoker    Packs/day: 1.00    Years: 3.00    Pack years: 3.00    Types: Cigarettes  . Smokeless tobacco: Never Used  Substance and Sexual Activity  . Alcohol use: No  . Drug use: Yes    Types: Marijuana, Cocaine, Benzodiazepines, Other-see comments, IV, Heroin     Comment: THC, heroin (snorted), and xanax OD 10/28/2018  . Sexual activity: Yes    Birth control/protection: None  Lifestyle  . Physical activity:    Days per week: Not on file    Minutes per session: Not on file  . Stress: Not on file  Relationships  . Social connections:    Talks on phone: Not on file    Gets together: Not on file    Attends religious service: Not on file    Active member of club or organization: Not on file    Attends meetings of clubs or organizations: Not on file    Relationship status: Not on file  . Intimate partner violence:    Fear of current or ex partner: Not on file    Emotionally abused: Not on file    Physically abused: Not on file    Forced sexual activity: Not on file  Other Topics Concern  . Not on file  Social History Narrative  . Not on file    Outpatient Medications Prior to Visit  Medication Sig Dispense Refill  . Glecaprevir-Pibrentasvir (MAVYRET) 100-40 MG TABS Take 3 tablets by mouth daily with breakfast. 84 tablet 1  . methadone (DOLOPHINE) 10 MG/5ML solution Take 80 mg by mouth daily. She is currently titrating Methadone.     No facility-administered medications prior to visit.     No Known Allergies  ROS Review of Systems  Constitutional: Negative.   HENT: Negative.   Eyes: Negative.   Respiratory: Negative.   Cardiovascular: Negative.   Gastrointestinal: Negative.   Endocrine: Negative.   Genitourinary: Positive for frequency.       Episodes of incontinence.   Musculoskeletal: Negative.   Skin: Negative.   Allergic/Immunologic: Negative.   Neurological: Negative.   Hematological: Negative.   Psychiatric/Behavioral: Negative.    Objective:    Physical Exam  Constitutional: She is oriented to person, place, and time. She appears well-developed and well-nourished.  HENT:  Head: Normocephalic and atraumatic.  Eyes: Conjunctivae are normal.  Neck: Normal range of motion. Neck supple.  Cardiovascular: Normal rate,  regular rhythm, normal heart sounds and intact distal pulses.  Pulmonary/Chest: Effort normal and breath sounds normal.  Abdominal: Soft. Bowel sounds are normal.  Musculoskeletal: Normal range of motion.  Neurological: She is alert and oriented to person, place, and time. She has normal reflexes.  Skin: Skin is warm and dry.  Psychiatric: She has a normal mood and affect. Her behavior is normal. Judgment and thought content normal.  Nursing note and vitals  reviewed.   BP 102/60 (BP Location: Left Arm, Patient Position: Sitting, Cuff Size: Small)   Pulse 98   Temp 97.9 F (36.6 C) (Oral)   Ht  (1.702 m)   Wt 138 lb (62.6 kg)   LMP 09/13/2018   SpO2 99%   BMI 21.61 kg/m  Wt Readings from Last 3 Encounters:  01/08/19 138 lb (62.6 kg)  12/12/18 137 lb (62.1 kg)  12/03/18 136 lb (61.7 kg)     Health Maintenance Due  Topic Date Due  . CHLAMYDIA SCREENING  10/27/2015    There are no preventive care reminders to display for this patient.  Lab Results  Component Value Date   TSH 1.867 10/07/2016   Lab Results  Component Value Date   WBC 4.0 12/12/2018   HGB 13.1 12/12/2018   HCT 38.3 12/12/2018   MCV 88.9 12/12/2018   PLT 170 12/12/2018   Lab Results  Component Value Date   NA 139 12/12/2018   K 4.3 12/12/2018   CO2 31 12/12/2018   GLUCOSE 94 12/12/2018   BUN 12 12/12/2018   CREATININE 0.88 12/12/2018   BILITOT 0.6 12/12/2018   ALKPHOS 123 11/29/2018   AST 97 (H) 12/12/2018   ALT 112 (H) 12/12/2018   ALT 113 (H) 12/12/2018   PROT 6.5 12/12/2018   ALBUMIN 3.7 11/29/2018   CALCIUM 9.0 12/12/2018   ANIONGAP 7 11/29/2018   Lab Results  Component Value Date   CHOL 139 07/24/2013   Lab Results  Component Value Date   HDL 39 07/24/2013   Lab Results  Component Value Date   LDLCALC 80 07/24/2013   Lab Results  Component Value Date   TRIG 100 07/24/2013   Lab Results  Component Value Date   CHOLHDL 3.6 07/24/2013   No results found for: HGBA1C     Assessment & Plan:   1. Urinary frequency Abnormal. Will will order Urine Culture today to further evaluate.  - POCT urinalysis dipstick  2. Missed periods Negative for pregnancy today.  - POCT urine pregnancy  3. Anxiety Stable. She continues to follow up in Psychiatrist as needed.   4. Follow up She will follow up in 3 months.   No orders of the defined types were placed in this encounter.   Orders Placed This Encounter  Procedures  . POCT urinalysis dipstick  . POCT urine pregnancy    Referral Orders  No referral(s) requested today    Raliegh Ip,  MSN, FNP-C [facility] [address] [place], [place] [address] [phone]   Problem List Items Addressed This Visit    None    Visit Diagnoses    Urinary frequency    -  Primary   Relevant Orders   POCT urinalysis dipstick   Missed periods       Relevant Orders   POCT urine pregnancy   Anxiety       Follow up          No orders of the defined types were placed in this encounter.   Follow-up: Return in about 3 months (around 04/09/2019).    Kallie Locks, FNP

## 2019-01-10 LAB — URINE CULTURE

## 2019-01-11 ENCOUNTER — Telehealth: Payer: Self-pay | Admitting: Obstetrics and Gynecology

## 2019-01-11 NOTE — Telephone Encounter (Signed)
Patient called, stated that she has not had a cycle in 5 months.  Stated she is not pregnant, that she has had several test done at doctor's offices and they are all negative.  She is wanting an appointment.  [phone]

## 2019-01-14 NOTE — Telephone Encounter (Signed)
Called patient back and informed her that we can set her up for a telephone visit.

## 2019-01-17 ENCOUNTER — Encounter: Payer: Self-pay | Admitting: Women's Health

## 2019-01-17 ENCOUNTER — Ambulatory Visit (INDEPENDENT_AMBULATORY_CARE_PROVIDER_SITE_OTHER): Payer: Medicaid Other | Admitting: Women's Health

## 2019-01-17 ENCOUNTER — Other Ambulatory Visit: Payer: Self-pay

## 2019-01-17 VITALS — Ht 68.0 in | Wt 137.0 lb

## 2019-01-17 DIAGNOSIS — N911 Secondary amenorrhea: Secondary | ICD-10-CM

## 2019-01-17 MED ORDER — MEDROXYPROGESTERONE ACETATE 10 MG PO TABS: 10.0000 mg | ORAL_TABLET | Freq: Every day | ORAL | 0 refills | Status: DC

## 2019-01-17 NOTE — Progress Notes (Signed)
TELEHEALTH VIRTUAL GYN VISIT ENCOUNTER NOTE Patient name: Tracy Stout MRN [id number]  Date of birth: 30-Apr-1996  I connected with patient on 01/17/19 at 12:00 PM EDT by [facility] and verified that I am speaking with the correct person using two identifiers.  Due to COVID-19 recommendations, pt is not currently in the office.    I discussed the limitations, risks, security and privacy concerns of performing an evaluation and management service by telephone and the availability of in person appointments. I also discussed with the patient that there may be a patient responsible charge related to this service. The patient expressed understanding and agreed to proceed.   Chief Complaint:   no period 5 months (pregnancy test negative)  History of Present Illness:   Tracy Stout is a 23 y.o. G61P1001 Caucasian female being evaluated today for report of amenorrhea x . Periods were regular prior to this. Not on birth control, uses pull out method. Has taken multiple HPTs, all neg, last one last week.  Recently dx w/ Hep C, on Mavyret for that. Also on methadone. Was on 'a lot' of drugs, decided to get clean. So, has been under some stress lately. No recent change in weight.  [name] last menstrual period was 07/27/2018. The current method of family planning is coitus interruptus. Last pap <21yo. Results were:  n/a Review of Systems:   Pertinent items are noted in HPI Denies fever/chills, dizziness, headaches, visual disturbances, fatigue, shortness of breath, chest pain, abdominal pain, vomiting, abnormal vaginal discharge/itching/odor/irritation, problems with periods, bowel movements, urination, or intercourse unless otherwise stated above.  Pertinent History Reviewed:  Reviewed past medical,surgical, social, obstetrical and family history.  Reviewed problem list, medications and allergies. Physical Assessment:   Vitals:   01/17/19 1230  Weight: 137 lb (62.1 kg)  Height: 5\' 8"  (1.727 m)   Body mass index is 20.83 kg/m.       Physical Examination:   General:  Alert, oriented and cooperative.   Mental Status: Normal mood and affect perceived. Normal judgment and thought content.  Physical exam deferred due to nature of the encounter  No results found for this or any previous visit (from the past 24 hour(s)).  Assessment & Plan:  1) Secondary amenorrhea> likely r/t stress, etc. Discussed COCs, however increased r/f hepatotoxicity in combination w/ HepC meds she is on. Rx provera daily x 10d, let us know if doesn't have w/drawal bleed after. Discussed options of progestin-only birth control for now (depo, nexplanon, IUD), wants to discuss w/ her mom, will let us know  Meds:  Meds ordered this encounter  Medications  . medroxyPROGESTERone (PROVERA) 10 MG tablet    Sig: Take 1 tablet (10 mg total) by mouth daily.    Dispense:  10 tablet    Refill:  0    Order Specific Question:   Supervising Provider    Answer:   Despina Hidden, LUTHER H [[id number]]    No orders of the defined types were placed in this encounter.   I discussed the assessment and treatment plan with the patient. The patient was provided an opportunity to ask questions and all were answered. The patient agreed with the plan and demonstrated an understanding of the instructions.   The patient was advised to call back or seek an in-person evaluation/go to the ED if the symptoms worsen or if the condition fails to improve as anticipated.  I provided 10 minutes of non-face-to-face time during this encounter.   Return in about 1 month (around 02/16/2019)  for F/U webex.  Cheral MarkerKimberly R  CNM, [facility]NP[facility] 01/17/2019 12:58 PM

## 2019-01-21 MED FILL — MAVYRET 100-40 MG TABS: 100-40 | 28 days supply | Qty: 84 | Fill #1

## 2019-01-31 ENCOUNTER — Ambulatory Visit (INDEPENDENT_AMBULATORY_CARE_PROVIDER_SITE_OTHER): Payer: Medicaid Other | Admitting: Pharmacist

## 2019-01-31 ENCOUNTER — Other Ambulatory Visit: Payer: Self-pay

## 2019-01-31 DIAGNOSIS — B182 Chronic viral hepatitis C: Secondary | ICD-10-CM

## 2019-01-31 NOTE — Progress Notes (Signed)
Virtual Visit via Telephone Note  I connected with Tracy Stout on 01/31/2019 at  2:30 PM EDT by telephone and verified that I am speaking with the correct person using two identifiers.   I discussed the limitations, risks, security and privacy concerns of performing an evaluation and management service by telephone and the availability of in person appointments. I also discussed with the patient that there may be a patient responsible charge related to this service. The patient expressed understanding and agreed to proceed.  HPI: Tracy Stout is a 23 y.o. female who presents for Hepatitis C follow-up.  Medication: Mavyret x 8 weeks  Start Date: 12/27/2018  Hepatitis C Genotype: 3  Fibrosis Score: F2  Hepatitis C RNA: 441,000 on 12/12/2018  Patient Active Problem List   Diagnosis Date Noted  . Chronic hepatitis C without hepatic coma (HCC) 12/12/2018  . H/O Deliberate self-cutting 03/21/2017  . Drug use 11/07/2016  . Anemia   . History of pyelonephritis during pregnancy 10/06/2016  . 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 Generalized anxiety disorder 10/25/2014  . 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 ADHD (attention deficit hyperactivity disorder), combined type 07/23/2013    [name] Medications  New Prescriptions   No medications on file  Previous Medications   GLECAPREVIR-PIBRENTASVIR (MAVYRET) 100-40 MG TABS    Take 3 tablets by mouth daily with breakfast.   MEDROXYPROGESTERONE (PROVERA) 10 MG TABLET    Take 1 tablet (10 mg total) by mouth daily.   METHADONE (DOLOPHINE) 10 MG/5ML SOLUTION    Take 80 mg by mouth daily. She is currently titrating Methadone.  Modified Medications   No medications on file  Discontinued Medications   No medications on file    Allergies: No Known Allergies  Past Medical History: Past Medical History:  Diagnosis Date  . ADHD (attention deficit hyperactivity disorder)   . Anxiety   . Bipolar 1  disorder (HCC)   . Borderline personality disorder (HCC)   . Depression   . Pneumonia   . Polysubstance abuse (HCC)   . PTSD (post-traumatic stress disorder)   . PTSD (post-traumatic stress disorder)   . Self-inflicted injury   . Suicide attempt (HCC)   . Supervision of normal pregnancy 06/30/2016    [facility] at   6+2 weeks FOB  Tracy Stout 23 yo bm 4th Dating By  Tracy Stout  Pap   NA GC/CT Initial:                36+wks: Genetic Screen NT/IT:  CF screen  Anatomic Tracy Stout  Flu vaccine  Tdap Recommended ~ 28wks Glucose Screen  2 hr GBS  Feed Preference  Contraception  Circumcision  Childbirth Classes  Pediatrician    . UTI (urinary tract infection) during pregnancy 09/2016    Social History: Social History   Socioeconomic History  . Marital status: Single    Spouse name: Not on file  . Number of children: 1  . Years of education: Not on file  . Highest education level: Not on file  Occupational History  . Not on file  Social Needs  . Financial resource strain: Not on file  . Food insecurity:    Worry: Not on file    Inability: Not on file  . Transportation needs:    Medical: Not on file    Non-medical: Not on file  Tobacco Use  . Smoking status: Current Every Day Smoker    Packs/day: 1.00  Years: 3.00    Pack years: 3.00    Types: Cigarettes  . Smokeless tobacco: Never Used  Substance and Sexual Activity  . Alcohol use: No  . Drug use: Yes    Types: Marijuana, Cocaine, Benzodiazepines, Other-see comments, IV, Heroin    Comment: THC, heroin (snorted), and xanax OD 10/28/2018  . Sexual activity: Yes    Birth control/protection: None  Lifestyle  . Physical activity:    Days per week: Not on file    Minutes per session: Not on file  . Stress: Not on file  Relationships  . Social connections:    Talks on phone: Not on file    Gets together: Not on file    Attends religious service: Not on file    Active member of club or organization: Not on file     Attends meetings of clubs or organizations: Not on file    Relationship status: Not on file  Other Topics Concern  . Not on file  Social History Narrative  . Not on file    Labs: Hepatitis C Lab Results  Component Value Date   HCVGENOTYPE 3 12/12/2018   HCVRNAPCRQN 441,000 (H) 12/12/2018   FIBROSTAGE F2 12/12/2018   Hepatitis B Lab Results  Component Value Date   HEPBSAB NON-REACTIVE 12/12/2018   HEPBSAG Negative 11/29/2018   Hepatitis A No results found for: HAV HIV Lab Results  Component Value Date   HIV Non Reactive 11/30/2016   HIV Non Reactive 06/30/2016   Lab Results  Component Value Date   CREATININE 0.88 12/12/2018   CREATININE 1.00 11/29/2018   CREATININE 0.72 10/28/2018   CREATININE 0.66 08/12/2018   CREATININE 1.02 (H) 05/13/2018   Lab Results  Component Value Date   AST 97 (H) 12/12/2018   AST 50 (H) 11/29/2018   AST 75 (H) 10/28/2018   ALT 112 (H) 12/12/2018   ALT 113 (H) 12/12/2018   ALT 181 (H) 11/29/2018   INR 1.27 10/08/2016   INR 1.24 10/08/2016    Assessment: I spoke with Tracy Stout today via phone and conducted an evisit.  She started Mavyret x 8 weeks for her chronic Hepatitis C infection back in April.  She is already on her 2nd and final month and has 2.5 weeks left of treatment.  She states that she is actually doing very well on it so far (she was surprised how well she is doing).  She denies missing any doses but states that she has taken it a few hours late several times.  No complete days missed.  She has an alarm on her phone that goes off at 5:30pm so she can take it with dinner.  She takes all three tablets together.    She is experiencing some fatigue but states it is getting better with time. Congratulated her on her perfect adherence and encouraged continued compliance.  I will make a follow-up appointment with me in 1 month for lab work. She agrees with the plan. I told her to continue doing well and not miss any doses in her  remaining few weeks of treatment.   Plan: - Continue Mavyret for 8 weeks - F/u with me 6/10 at 4pm for labwork  I discussed the assessment and treatment plan with the patient. The patient was provided an opportunity to ask questions and all were answered. The patient agreed with the plan and demonstrated an understanding of the instructions.   The patient was advised to call back or seek an in-person evaluation  if the symptoms worsen or if the condition fails to improve as anticipated.  I provided 10 minutes of non-face-to-face time during this encounter.  Karlina Suares L. Romi Rathel, PharmD, BCIDP, AAHIVP, CPP Infectious Diseases Clinical Pharmacist [facility] 02/01/2019, 2:06 PM

## 2019-02-01 ENCOUNTER — Ambulatory Visit: Payer: Medicaid Other | Admitting: Pharmacist

## 2019-02-12 ENCOUNTER — Telehealth: Payer: Self-pay | Admitting: Pharmacist

## 2019-02-12 NOTE — Telephone Encounter (Signed)
Patient called "freaking out" because she thinks she missed one dose of her Mavyret for her Hepatitis C infection. She wanted me to help her count how many days she should have left and if she should double up tonight.  I told her that one dose wasn't going to worry me too much and to make sure she takes it as directed for the remainder of her treatment. She only has 3 days left.  Also advised her not to double up. She verbalized understanding.

## 2019-02-19 ENCOUNTER — Ambulatory Visit: Payer: BLUE CROSS/BLUE SHIELD | Admitting: Women's Health

## 2019-02-21 ENCOUNTER — Other Ambulatory Visit: Payer: Self-pay

## 2019-02-21 ENCOUNTER — Ambulatory Visit (INDEPENDENT_AMBULATORY_CARE_PROVIDER_SITE_OTHER): Payer: Medicaid Other | Admitting: Women's Health

## 2019-02-21 ENCOUNTER — Encounter: Payer: Self-pay | Admitting: Women's Health

## 2019-02-21 VITALS — Ht 67.0 in | Wt 137.0 lb

## 2019-02-21 DIAGNOSIS — Z30011 Encounter for initial prescription of contraceptive pills: Secondary | ICD-10-CM | POA: Diagnosis not present

## 2019-02-21 MED ORDER — LO LOESTRIN FE 1 MG-10 MCG / 10 MCG PO TABS: 1.0000 | ORAL_TABLET | Freq: Every day | ORAL | 3 refills | Status: DC

## 2019-02-21 NOTE — Progress Notes (Signed)
TELEHEALTH VIRTUAL GYN VISIT ENCOUNTER NOTE Patient name: Tracy Stout MRN [id number]  Date of birth: 1996-06-14  I connected with patient on 02/21/19 at  2:45 PM EDT by [facility] and verified that I am speaking with the correct person using two identifiers.  Due to COVID-19 recommendations, pt is not currently in the office.    I discussed the limitations, risks, security and privacy concerns of performing an evaluation and management service by telephone and the availability of in person appointments. I also discussed with the patient that there may be a patient responsible charge related to this service. The patient expressed understanding and agreed to proceed.   Chief Complaint:   Follow-up (amenorrhea)  History of Present Illness:   Tracy Stout is a 23 y.o. G66P1001 Caucasian female being evaluated today for f/u secondary amenorrhea. Rx'd provera x 10d on 4/23 for no period in . Started spotting x 3d after finishing provera, then had very heavy period. Wants to start birth control, is finished taking HepC meds. Wants COCs. Does smoke. No h/o HTN, DVT/PE, CVA, MI, or migraines w/ aura.      [name] last menstrual period was 02/06/2019. The current method of family planning is none. Last pap turns 23yo on 6/5. Results were:  n/a Review of Systems:   Pertinent items are noted in HPI Denies fever/chills, dizziness, headaches, visual disturbances, fatigue, shortness of breath, chest pain, abdominal pain, vomiting, abnormal vaginal discharge/itching/odor/irritation, problems with periods, bowel movements, urination, or intercourse unless otherwise stated above.  Pertinent History Reviewed:  Reviewed past medical,surgical, social, obstetrical and family history.  Reviewed problem list, medications and allergies. Physical Assessment:   Vitals:   02/21/19 1534  Weight: 137 lb (62.1 kg)  Height: 5\' 7"  (1.702 m)  Body mass index is 21.46 kg/m.       Physical Examination:   General:   Alert, oriented and cooperative.   Mental Status: Normal mood and affect perceived. Normal judgment and thought content.  Physical exam deferred due to nature of the encounter  No results found for this or any previous visit (from the past 24 hour(s)).  Assessment & Plan:  1) Resolved amenorrhea> bled after provera challenge  2) Contraception management> rx LoLoestrin, condoms always for STI prevention. Smoker- advised cessation, discussed potential adverse effects DVT/PE, MI, CVA, HTN w/ estrogen/smoking  Meds:  Meds ordered this encounter  Medications  . LO LOESTRIN FE 1 MG-10 MCG / 10 MCG tablet    Sig: Take 1 tablet by mouth daily.    Dispense:  3 Package    Refill:  3    For co-pay card, pt to text "Lo Loestrin Fe " to [phone]              Co-pay card must be run in second position  "other coverage code 3"  if denied d/t PA, step edit, or insurance denial    Order Specific Question:   Supervising Provider    Answer:   Despina Hidden, LUTHER H [[id number]]    No orders of the defined types were placed in this encounter.   I discussed the assessment and treatment plan with the patient. The patient was provided an opportunity to ask questions and all were answered. The patient agreed with the plan and demonstrated an understanding of the instructions.   The patient was advised to call back or seek an in-person evaluation/go to the ED if the symptoms worsen or if the condition fails to improve as anticipated.  I provided 15  minutes of non-face-to-face time during this encounter.   Return in about 3 months (around 05/24/2019) for Pap & physical. and f/u on contraception  Cheral MarkerKimberly R Booker CNM, [facility]NP-BC 02/21/2019 3:54 PM

## 2019-02-21 NOTE — Patient Instructions (Signed)
Oral Contraception Use  Oral contraceptive pills (OCPs) are medicines that you take to prevent pregnancy. OCPs work by:  · Preventing the ovaries from releasing eggs.  · Thickening mucus in the lower part of the uterus (cervix), which prevents sperm from entering the uterus.  · Thinning the lining of the uterus (endometrium), which prevents a fertilized egg from attaching to the endometrium.  OCPs are highly effective when taken exactly as prescribed. However, OCPs do not prevent sexually transmitted infections (STIs). Safe sex practices, such as using condoms while on an OCP, can help prevent STIs.  Before taking OCPs, you may have a physical exam, blood test, and Pap test. A Pap test involves taking a sample of cells from your cervix to check for cancer. Discuss with your health care provider the possible side effects of the OCP you may be prescribed. When you start an OCP, be aware that it can take 2-3 months for your body to adjust to changes in hormone levels.  How to take oral contraceptive pills  Follow instructions from your health care provider about how to start taking your first cycle of OCPs. Your health care provider may recommend that you:  · Start the pill on day 1 of your menstrual period. If you start at this time, you will not need any backup form of birth control (contraception), such as condoms.  · Start the pill on the first Sunday after your menstrual period or on the day you get your prescription. In these cases, you will need to use backup contraception for the first week.  · Start the pill at any time of your cycle.  ? If you take the pill within 5 days of the start of your period, you will not need a backup form of contraception.  ? If you start at any other time of your menstrual cycle, you will need to use another form of contraception for 7 days. If your OCP is the type called a minipill, it will protect you from pregnancy after taking it for 2 days (48 hours), and you can stop using  backup contraception after that time.  After you have started taking OCPs:  · If you forget to take 1 pill, take it as soon as you remember. Take the next pill at the regular time.  · If you miss 2 or more pills, call your health care provider. Different pills have different instructions for missed doses. Use backup birth control until your next menstrual period starts.  · If you use a 28-day pack that contains inactive pills and you miss 1 of the last 7 pills (pills with no hormones), throw away the rest of the non-hormone pills and start a new pill pack.  No matter which day you start the OCP, you will always start a new pack on that same day of the week. Have an extra pack of OCPs and a backup contraceptive method available in case you miss some pills or lose your OCP pack.  Follow these instructions at home:  · Do not use any products that contain nicotine or tobacco, such as cigarettes and e-cigarettes. If you need help quitting, ask your health care provider.  · Always use a condom to protect against STIs. OCPs do not protect against STIs.  · Use a calendar to mark the days of your menstrual period.  · Read the information and directions that came with your OCP. Talk to your health care provider if you have questions.  Contact a   health care provider if:  · You develop nausea and vomiting.  · You have abnormal vaginal discharge or bleeding.  · You develop a rash.  · You miss your menstrual period. Depending on the type of OCP you are taking, this may be a sign of pregnancy. Ask your health care provider for more information.  · You are losing your hair.  · You need treatment for mood swings or depression.  · You get dizzy when taking the OCP.  · You develop acne after taking the OCP.  · You become pregnant or think you may be pregnant.  · You have diarrhea, constipation, and abdominal pain or cramps.  · You miss 2 or more pills.  Get help right away if:  · You develop chest pain.  · You develop shortness of  breath.  · You have an uncontrolled or severe headache.  · You develop numbness or slurred speech.  · You develop visual or speech problems.  · You develop pain, redness, and swelling in your legs.  · You develop weakness or numbness in your arms or legs.  Summary  · Oral contraceptive pills (OCPs) are medicines that you take to prevent pregnancy.  · OCPs do not prevent sexually transmitted infections (STIs). Always use a condom to protect against STIs.  · When you start an OCP, be aware that it can take 2-3 months for your body to adjust to changes in hormone levels.  · Read all the information and directions that come with your OCP.  This information is not intended to replace advice given to you by your health care provider. Make sure you discuss any questions you have with your health care provider.  Document Released: 09/01/2011 Document Revised: 10/24/2016 Document Reviewed: 10/24/2016  Elsevier Interactive Patient Education © 2019 [employer]

## 2019-02-25 ENCOUNTER — Telehealth: Payer: Self-pay

## 2019-02-25 NOTE — Telephone Encounter (Signed)
She may have completely messed up her treatment and may not be cured but, yes please tell her to finish out the remainder of her medication. If you could, please remind her of her appointment next week and to make sure she shows up. Thank you!

## 2019-02-25 NOTE — Telephone Encounter (Signed)
Patient taking Hep C medication and has missed at least one week of medication after being without for 2 weeks.   They thought she had completed the full treatment but found the medication this morning in the medicine cabinet. Should she take the week of medication? Labs next week.   Please advise.

## 2019-02-25 NOTE — Telephone Encounter (Signed)
[name] Mother informed to complete the week of medication left and follow up with labs.   Laurell Josephs, RN

## 2019-03-01 ENCOUNTER — Ambulatory Visit (HOSPITAL_COMMUNITY): Payer: Medicaid Other | Admitting: Psychiatry

## 2019-03-05 ENCOUNTER — Telehealth: Payer: Self-pay | Admitting: Family

## 2019-03-05 NOTE — Telephone Encounter (Signed)
COVID-19 Pre-Screening Questions: ° °Do you currently have a fever (>100 °F), chills or unexplained body aches?no  ° °Are you currently experiencing new cough, shortness of breath, sore throat, runny nose?no  °•  °Have you recently travelled outside the state of [place] in the last 14 days? No  °•  °1. Have you been in contact with someone that is currently pending confirmation of Covid19 testing or has been confirmed to have the Covid19 virus?  No  ° °

## 2019-03-05 NOTE — Telephone Encounter (Signed)
COVID-19 Pre-Screening Questions: ° °Do you currently have a fever (>100 °F), chills or unexplained body aches? No   ° °Are you currently experiencing new cough, shortness of breath, sore throat, runny nose? No   °•  °Have you recently travelled outside the state of [place] in the last 14 days? no °•  °1. Have you been in contact with someone that is currently pending confirmation of Covid19 testing or has been confirmed to have the Covid19 virus?  No  ° °

## 2019-03-06 ENCOUNTER — Other Ambulatory Visit: Payer: Self-pay

## 2019-03-06 ENCOUNTER — Ambulatory Visit (INDEPENDENT_AMBULATORY_CARE_PROVIDER_SITE_OTHER): Payer: Medicaid Other | Admitting: Family

## 2019-03-06 ENCOUNTER — Encounter: Payer: Self-pay | Admitting: Family

## 2019-03-06 ENCOUNTER — Ambulatory Visit: Payer: Medicaid Other | Admitting: Pharmacist

## 2019-03-06 VITALS — BP 95/64 | HR 56 | Temp 98.6°F | Wt 137.0 lb

## 2019-03-06 DIAGNOSIS — B182 Chronic viral hepatitis C: Secondary | ICD-10-CM | POA: Diagnosis present
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
## 2019-03-06 NOTE — Assessment & Plan Note (Signed)
Tracy Stout has completed her treatment for Genotype 3 Hepatitis C with concern for treatment failure as she completed 7 weeks of therapy and forgot about the medication having finished the remaining week about a week ago. Will check viral load today, if viral load present likely treatment failure. Discussed plan of care with [name] and she is in agreement. Follow up pending blood work results.

## 2019-03-06 NOTE — Patient Instructions (Signed)
Nice to see you.   We will check your blood work today.  We will call with the results.  If negative will see you back in 3 months if positive will consider re-treatment.

## 2019-03-14 ENCOUNTER — Telehealth: Payer: Self-pay | Admitting: Family

## 2019-03-14 LAB — HEPATITIS C RNA QUANTITATIVE
HCV Quantitative Log: <1.18 Log IU/mL
HCV RNA, PCR, QN: <15 IU/mL

## 2019-03-14 LAB — COMPLETE METABOLIC PANEL WITH GFR
AG Ratio: 1.8 (calc) (ref 1.0–2.5)
ALT: 7 U/L (ref 6–29)
AST: 15 U/L (ref 10–30)
Albumin: 4 g/dL (ref 3.6–5.1)
Alkaline phosphatase (APISO): 71 U/L (ref 31–125)
BUN: 8 mg/dL (ref 7–25)
CO2: 29 mmol/L (ref 20–32)
Calcium: 8.8 mg/dL (ref 8.6–10.2)
Chloride: 105 mmol/L (ref 98–110)
Creat: 0.75 mg/dL (ref 0.50–1.10)
GFR, Est African American: 132 mL/min/{1.73_m2} (ref >=60)
GFR, Est Non African American: 114 mL/min/{1.73_m2} (ref >=60)
Globulin: 2.2 g/dL (calc) (ref 1.9–3.7)
Glucose, Bld: 74 mg/dL (ref 65–99)
Potassium: 4.6 mmol/L (ref 3.5–5.3)
Sodium: 139 mmol/L (ref 135–146)
Total Bilirubin: 0.5 mg/dL (ref 0.2–1.2)
Total Protein: 6.2 g/dL (ref 6.1–8.1)

## 2019-03-14 NOTE — Telephone Encounter (Signed)
Attempted to call Tracy Stout to inform her that her viral load is undetectable at present and will wait 3 months to retest to confirm treatment success. Unfortunately her mailbox was full and was unable to leave a message.

## 2019-03-18 NOTE — Telephone Encounter (Signed)
Attempt to call patient again. Mailbox remains full.  Tracy Mcalpine, LPN

## 2019-03-19 NOTE — Telephone Encounter (Signed)
Patient returned call and informer her of non detectable results and follow up in 3 months. Also informed her that her voicemail box was full.  Eugenia Mcalpine, LPN

## 2019-03-21 ENCOUNTER — Telehealth: Payer: Self-pay | Admitting: Women's Health

## 2019-03-21 NOTE — Telephone Encounter (Signed)
Pt is on Lo Loestrin. Started it the end of May. I advised period can be irregular at first but usually bleeding gets better; may not even have a period at all eventually. Advised to try and stick with pill for 3-4 packs to see how period does. Pt voiced understanding. [place]

## 2019-03-21 NOTE — Telephone Encounter (Signed)
Left message @ 4:03 pm. JSY 

## 2019-03-21 NOTE — Telephone Encounter (Signed)
Pt returning your call. States she will not be able to answer this evening if you call back.

## 2019-03-21 NOTE — Telephone Encounter (Signed)
Patient called, stated Tracy Stout put her on meds to start her cycle, she had a normal cycle for one month, then the next and now for three weeks of constant bleeding.  She has to be at work at 2pm if she could possibly get a call back before then.  [phone]

## 2019-04-08 ENCOUNTER — Telehealth: Payer: Self-pay | Admitting: Women's Health
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
## 2019-04-09 ENCOUNTER — Ambulatory Visit: Payer: Medicaid Other | Admitting: Family Medicine

## 2019-05-21 ENCOUNTER — Other Ambulatory Visit: Payer: Medicaid Other | Admitting: Women's Health

## 2019-06-05 ENCOUNTER — Telehealth: Payer: Self-pay | Admitting: *Deleted
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
## 2021-12-15 ENCOUNTER — Other Ambulatory Visit: Payer: Self-pay

## 2021-12-15 ENCOUNTER — Ambulatory Visit
Admission: EM | Admit: 2021-12-15 | Discharge: 2021-12-15 | Disposition: A | Discharge status: Home / Self Care | Payer: Medicaid Other | Attending: Urgent Care | Admitting: Urgent Care

## 2021-12-15 DIAGNOSIS — R0981 Nasal congestion: Secondary | ICD-10-CM | POA: Diagnosis not present

## 2021-12-15 DIAGNOSIS — J069 Acute upper respiratory infection, unspecified: Secondary | ICD-10-CM | POA: Diagnosis not present

## 2021-12-15 DIAGNOSIS — R519 Headache, unspecified: Secondary | ICD-10-CM

## 2021-12-15 DIAGNOSIS — R052 Subacute cough: Secondary | ICD-10-CM

## 2021-12-15 DIAGNOSIS — F17200 Nicotine dependence, unspecified, uncomplicated: Secondary | ICD-10-CM

## 2021-12-15 DIAGNOSIS — R400 Somnolence: Secondary | ICD-10-CM

## 2021-12-15 MED ORDER — EXCEDRIN MIGRAINE 250-250-65 MG PO TABS: 1.0000 | ORAL_TABLET | Freq: Four times a day (QID) | ORAL | 0 refills | Status: DC | PRN

## 2021-12-15 MED ORDER — LEVOCETIRIZINE DIHYDROCHLORIDE 5 MG PO TABS: 5.0000 mg | ORAL_TABLET | Freq: Every evening | ORAL | 0 refills | Status: DC

## 2021-12-15 MED ORDER — PROMETHAZINE-DM 6.25-15 MG/5ML PO SYRP: 5.0000 mL | ORAL_SOLUTION | Freq: Four times a day (QID) | ORAL | 0 refills | Status: DC | PRN

## 2021-12-15 MED ORDER — PREDNISONE 20 MG PO TABS: ORAL_TABLET | ORAL | 0 refills | Status: DC

## 2021-12-15 MED ORDER — METHYLPREDNISOLONE SODIUM SUCC 125 MG IJ SOLR: 125.0000 mg | Freq: Once | INTRAMUSCULAR | Status: DC

## 2021-12-15 NOTE — ED Notes (Signed)
Pt refused after she asked for the injection.  ?

## 2021-12-15 NOTE — Discharge Instructions (Addendum)
Since you received a steroid injection today, we will have you start oral prednisone tomorrow. Otherwise, use the Excedrin for your headaches, cough syrup as needed. Start levocetirizine every day to help with your sinuses long term.  ?
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
## 2021-12-15 NOTE — ED Triage Notes (Signed)
Pt reports fever 102.5 F, migraine, vomiting x 1 day. Tylenol, ibuprofen gives no relief.  ?

## 2023-01-18 ENCOUNTER — Ambulatory Visit: Payer: Medicaid Other | Admitting: Family Medicine

## 2023-04-01 ENCOUNTER — Encounter (HOSPITAL_COMMUNITY): Payer: Self-pay

## 2023-04-01 ENCOUNTER — Emergency Department (HOSPITAL_COMMUNITY)
Admission: EM | Admit: 2023-04-01 | Discharge: 2023-04-01 | Discharge status: Against Medical Advice | Payer: MEDICAID | Attending: Emergency Medicine | Admitting: Emergency Medicine

## 2023-04-01 ENCOUNTER — Other Ambulatory Visit: Payer: Self-pay

## 2023-04-01 DIAGNOSIS — Z7982 Long term (current) use of aspirin: Secondary | ICD-10-CM | POA: Insufficient documentation

## 2023-04-01 DIAGNOSIS — Z5329 Procedure and treatment not carried out because of patient's decision for other reasons: Secondary | ICD-10-CM | POA: Diagnosis not present

## 2023-04-01 DIAGNOSIS — T402X1A Poisoning by other opioids, accidental (unintentional), initial encounter: Secondary | ICD-10-CM | POA: Diagnosis present

## 2023-04-01 DIAGNOSIS — T40601A Poisoning by unspecified narcotics, accidental (unintentional), initial encounter: Secondary | ICD-10-CM

## 2023-04-01 NOTE — ED Triage Notes (Signed)
Pt reports she has been clean of opiates for 15months and was feeling some anxiety so she took some fentanyl to try and relieve it.  Pt denies any SI feelings, reports it was an accident.

## 2023-04-01 NOTE — ED Notes (Signed)
Pt requesting IV to be removed as she would like to leave.  Pt verbalized understanding of risks to leaving and stated she would return if she had any trouble.  Pt refused to sign AMA form as there was no computer at the bedside and she did not want to wait.

## 2023-04-01 NOTE — Discharge Instructions (Signed)
You are leaving against our advice.  This could cause life-threatening conditions such as death.  Feel free to return for further treatment.

## 2023-04-01 NOTE — ED Provider Notes (Signed)
[place] EMERGENCY DEPARTMENT AT [facility] Provider Note   CSN: [id number] Arrival date & time: 04/01/23  1148     History  Chief Complaint  Patient presents with   Drug Overdose    Tracy Stout is a 27 y.o. female.   Drug Overdose  Patient presents after fentanyl overdose.  States she was having some anxiety so took the fentanyl.  States she has not used in 15 months and overdosed.  Denies suicidal thoughts.  States accidental overdose.  States she will not use more.  Received Narcan by EMS.    Past Medical History:  Diagnosis Date   ADHD (attention deficit hyperactivity disorder)    Anxiety    Bipolar 1 disorder (HCC)    Borderline personality disorder (HCC)    Depression    Pneumonia    Polysubstance abuse (HCC)    PTSD (post-traumatic stress disorder)    PTSD (post-traumatic stress disorder)    Self-inflicted injury    Suicide attempt [facility])    Supervision of normal pregnancy 06/30/2016    [facility] at   6+2 weeks FOB  [name] 27 yo bm 4th Dating By  [name]  Pap   NA GC/CT Initial:                36+wks: Genetic Screen NT/IT:  CF screen  Anatomic [name]  Flu vaccine  Tdap Recommended ~ 28wks Glucose Screen  2 hr GBS  Feed Preference  Contraception  Circumcision  Childbirth Classes  Pediatrician     UTI (urinary tract infection) during pregnancy 09/2016    Home Medications Prior to Admission medications   Medication Sig Start Date End Date Taking? Authorizing Provider  aspirin-acetaminophen-caffeine (EXCEDRIN MIGRAINE) [phone] MG tablet Take 1 tablet by mouth every 6 (six) hours as needed for headache or migraine. 12/15/21   Wallis Bamberg, PA-C  levocetirizine (XYZAL) 5 MG tablet Take 1 tablet (5 mg total) by mouth every evening. 12/15/21   Wallis Bamberg, PA-C  LO LOESTRIN FE 1 MG-10 MCG / 10 MCG tablet Take 1 tablet by mouth daily. 02/21/19   Cheral Marker, CNM  medroxyPROGESTERone (PROVERA) 10 MG tablet Take 1 tablet (10 mg  total) by mouth daily. Patient not taking: Reported on 02/21/2019 01/17/19   Cheral Marker, CNM  methadone (DOLOPHINE) 10 MG/5ML solution Take 80 mg by mouth daily. She is currently titrating Methadone.    [provider]  predniSONE (DELTASONE) 20 MG tablet Take 2 tablets daily with breakfast. 12/15/21   Wallis Bamberg, PA-C  promethazine-dextromethorphan (PROMETHAZINE-DM) 6.25-15 MG/5ML syrup Take 5 mLs by mouth 4 (four) times daily as needed for cough. 12/15/21   Wallis Bamberg, PA-C      Allergies    Patient has no known allergies.    Review of Systems   Review of Systems  Physical Exam Updated Vital Signs BP 100/60 (BP Location: Left Arm)   Pulse (!) 56   Temp 98 F (36.7 C) (Oral)   Resp 16   Ht 5\' 7"  (1.702 m)   Wt 62 kg   SpO2 100%   BMI 21.41 kg/m  Physical Exam Vitals and nursing note reviewed.  Cardiovascular:     Rate and Rhythm: Regular rhythm.  Chest:     Chest wall: No tenderness.  Abdominal:     Tenderness: There is no abdominal tenderness.  Neurological:     Mental Status: She is alert and oriented to person, place, and time. Mental status is  at baseline.     ED Results / Procedures / Treatments   Labs (all labs ordered are listed, but only abnormal results are displayed) Labs Reviewed - No data to display  EKG None  Radiology No results found.  Procedures Procedures    Medications Ordered in ED Medications - No data to display  ED Course/ Medical Decision Making/ A&P                             Medical Decision Making  Patient opiate overdose.  Received Narcan by EMS.  Accidental fentanyl overdose.  Has been clean for reported 15 months.  Mental status much improved.  States accidental.  Patient not willing to stay.  Do not think I have the criteria to IVC her at this time for accidental overdose that states she will get treatment for.  Will leave AMA.        Final Clinical Impression(s) / ED Diagnoses Final diagnoses:   Opiate overdose, accidental or unintentional, initial encounter [facility])    Rx / DC Orders ED Discharge Orders     None         Benjiman Core, MD 04/01/23 1225

## 2023-05-18 ENCOUNTER — Ambulatory Visit: Payer: MEDICAID | Admitting: Family Medicine

## 2023-06-22 ENCOUNTER — Other Ambulatory Visit: Payer: Self-pay

## 2023-06-22 ENCOUNTER — Emergency Department (HOSPITAL_COMMUNITY)
Admission: EM | Admit: 2023-06-22 | Discharge: 2023-06-22 | Disposition: A | Discharge status: Home / Self Care | Payer: MEDICAID | Attending: Emergency Medicine | Admitting: Emergency Medicine

## 2023-06-22 ENCOUNTER — Emergency Department (HOSPITAL_COMMUNITY): Payer: MEDICAID

## 2023-06-22 ENCOUNTER — Encounter (HOSPITAL_COMMUNITY): Payer: Self-pay | Admitting: Emergency Medicine

## 2023-06-22 DIAGNOSIS — R634 Abnormal weight loss: Secondary | ICD-10-CM | POA: Diagnosis not present

## 2023-06-22 DIAGNOSIS — Z20822 Contact with and (suspected) exposure to covid-19: Secondary | ICD-10-CM | POA: Diagnosis not present

## 2023-06-22 DIAGNOSIS — R001 Bradycardia, unspecified: Secondary | ICD-10-CM | POA: Insufficient documentation

## 2023-06-22 DIAGNOSIS — R61 Generalized hyperhidrosis: Secondary | ICD-10-CM | POA: Diagnosis not present

## 2023-06-22 DIAGNOSIS — R059 Cough, unspecified: Secondary | ICD-10-CM | POA: Diagnosis not present

## 2023-06-22 DIAGNOSIS — R509 Fever, unspecified: Secondary | ICD-10-CM | POA: Insufficient documentation

## 2023-06-22 DIAGNOSIS — R42 Dizziness and giddiness: Secondary | ICD-10-CM | POA: Insufficient documentation

## 2023-06-22 LAB — URINALYSIS, ROUTINE W REFLEX MICROSCOPIC
Bilirubin Urine: NEGATIVE
Glucose, UA: NEGATIVE mg/dL
Ketones, ur: NEGATIVE mg/dL
Leukocytes,Ua: NEGATIVE
Nitrite: NEGATIVE
Protein, ur: NEGATIVE mg/dL
Specific Gravity, Urine: 1.008 (ref 1.005–1.030)
pH: 7 (ref 5.0–8.0)

## 2023-06-22 LAB — SARS CORONAVIRUS 2 BY RT PCR: SARS Coronavirus 2 by RT PCR: NEGATIVE

## 2023-06-22 LAB — COMPREHENSIVE METABOLIC PANEL
ALT: 10 U/L (ref 0–44)
AST: 14 U/L — ABNORMAL LOW (ref 15–41)
Albumin: 3.7 g/dL (ref 3.5–5.0)
Alkaline Phosphatase: 57 U/L (ref 38–126)
Anion gap: 8 (ref 5–15)
BUN: 8 mg/dL (ref 6–20)
CO2: 26 mmol/L (ref 22–32)
Calcium: 8.9 mg/dL (ref 8.9–10.3)
Chloride: 103 mmol/L (ref 98–111)
Creatinine, Ser: 0.62 mg/dL (ref 0.44–1.00)
GFR, Estimated: >60 mL/min (ref >60)
Glucose, Bld: 108 mg/dL — ABNORMAL HIGH (ref 70–99)
Potassium: 4.4 mmol/L (ref 3.5–5.1)
Sodium: 137 mmol/L (ref 135–145)
Total Bilirubin: 0.6 mg/dL (ref 0.3–1.2)
Total Protein: 7.3 g/dL (ref 6.5–8.1)

## 2023-06-22 LAB — CBC WITH DIFFERENTIAL/PLATELET
Abs Immature Granulocytes: 0.01 10*3/uL (ref 0.00–0.07)
Basophils Absolute: 0 10*3/uL (ref 0.0–0.1)
Basophils Relative: 0 %
Eosinophils Absolute: 0.1 10*3/uL (ref 0.0–0.5)
Eosinophils Relative: 1 %
HCT: 37.9 % (ref 36.0–46.0)
Hemoglobin: 12.6 g/dL (ref 12.0–15.0)
Immature Granulocytes: 0 %
Lymphocytes Relative: 24 %
Lymphs Abs: 1.7 10*3/uL (ref 0.7–4.0)
MCH: 31.1 pg (ref 26.0–34.0)
MCHC: 33.2 g/dL (ref 30.0–36.0)
MCV: 93.6 fL (ref 80.0–100.0)
Monocytes Absolute: 0.5 10*3/uL (ref 0.1–1.0)
Monocytes Relative: 7 %
Neutro Abs: 4.8 10*3/uL (ref 1.7–7.7)
Neutrophils Relative %: 68 %
Platelets: 195 10*3/uL (ref 150–400)
RBC: 4.05 MIL/uL (ref 3.87–5.11)
RDW: 12 % (ref 11.5–15.5)
WBC: 7.1 10*3/uL (ref 4.0–10.5)
nRBC: 0 % (ref 0.0–0.2)

## 2023-06-22 LAB — LACTIC ACID, PLASMA: Lactic Acid, Venous: 0.8 mmol/L (ref 0.5–1.9)

## 2023-06-22 LAB — PREGNANCY, URINE: Preg Test, Ur: NEGATIVE

## 2023-06-22 LAB — TSH: TSH: 1.215 u[IU]/mL (ref 0.350–4.500)

## 2023-06-22 MED ORDER — LACTATED RINGERS IV BOLUS
1000.0000 mL | Freq: Once | INTRAVENOUS | Status: AC
  Administered 2023-06-22: 1000 mL via INTRAVENOUS

## 2023-06-22 MED ORDER — AMOXICILLIN-POT CLAVULANATE 875-125 MG PO TABS: 1.0000 | ORAL_TABLET | Freq: Once | ORAL | Status: DC

## 2023-06-22 MED ORDER — PREDNISONE 20 MG PO TABS: 40.0000 mg | ORAL_TABLET | Freq: Once | ORAL | Status: DC

## 2023-06-22 MED ORDER — AZITHROMYCIN 250 MG PO TABS: 500.0000 mg | ORAL_TABLET | Freq: Once | ORAL | Status: DC

## 2023-06-22 NOTE — ED Notes (Signed)
Dc instructions reviewed wiith pt no questions or concerns at this time. Will follow up with pcp.

## 2023-06-22 NOTE — Discharge Instructions (Signed)
In the ER today for low blood pressures taken over the past couple of months.  Blood pressures here are reassuring, you are given some IV fluids.  Blood work is also reassuring.  We also checked your thyroid which was normal.  I am concerned that you are having night sweats so it is important for you to follow-up with your primary care doctor can do more testing as needed.  Come back to ER if you have new or worsening symptoms.  [facility] Primary Care Doctor List    Syliva Overman, MD. Specialty: [facility] Medicine Contact information: [address]  [place] [address]  [phone]   Lilyan Punt, MD. Specialty: [facility] Medicine Contact information: [address]  [place] [address]  [phone]   Avon Gully, MD Specialty: Internal Medicine Contact information: [address] [place] [address]  [phone]   Catalina Pizza, MD. Specialty: Internal Medicine Contact information: [address]  [place] [address]  [phone]    [facility] (Dr. Selena Batten) Specialty: Family Medicine Contact information: [address]  [place] [address]  [phone]   John Giovanni, MD. Specialty: [facility] Medicine Contact information: [address]  [address]  [place] [address]  [phone]   Carylon Perches, MD. Specialty: Internal Medicine Contact information: [address]  [address]  [place] [address]  [phone]    [facility]  [address] [place], [place] [address] [phone]  Services [facility] offers a variety of basic health services.  Services include but are not limited to: Blood pressure checks  Heart rate checks  Blood sugar checks  Urine analysis  Rapid strep tests  Pregnancy tests.  Health education and referrals  People needing more complex services will be directed to a physician online. Using these virtual visits,  doctors can evaluate and prescribe medicine and treatments. There will be no medication on-site, though [facility] will help patients fill their prescriptions at little to no cost.   For More information please go to: [email or web address]

## 2023-06-22 NOTE — ED Provider Notes (Signed)
[place] EMERGENCY DEPARTMENT AT [facility] Provider Note   CSN: [id number] Arrival date & time: 06/22/23  1117     History  Chief Complaint  Patient presents with   Hypotension    Tracy Stout is a 27 y.o. female.  She has past history of opiate use disorder currently on Suboxone therapy for this.  She presents the ER today concern for approximately 2 months of intermittently low blood pressure and low heart rate when she goes to Suboxone clinic.  She has a printout today of her vital signs for the past couple of months and blood pressures have ranged at the lowest of 90s over 60s to 1 teens over 70s.  Lowest heart rate was 46 and at that time her blood pressure was normal.   She states she had some intermittent dizziness, usually when she stands up from sitting but occasionally while she is sitting.  She has been having night sweats as well, states she soaks through she gets also for the past several months.  She has also had weight loss.  She notes that for the past 4 to 5 days she has had intermittent fevers up to 102 Fahrenheit, took Motrin about 3 hours ago for fever.  Denies cough or runny nose, no chest pain or shortness of breath no urinary symptoms.  No history of IV drug abuse per the patient, only current substance uses occasional marijuana.  States symptoms started before she started the Suboxone treatment  Patient denies any rashes, no dysuria but does have occasional right low back pain.  No sore throat she has had a cough for several months as well that is nonproductive.  HPI     Home Medications Prior to Admission medications   Medication Sig Start Date End Date Taking? Authorizing Provider  aspirin-acetaminophen-caffeine (EXCEDRIN MIGRAINE) [phone] MG tablet Take 1 tablet by mouth every 6 (six) hours as needed for headache or migraine. 12/15/21   Wallis Bamberg, PA-C  levocetirizine (XYZAL) 5 MG tablet Take 1 tablet (5 mg total) by mouth every evening.  12/15/21   Wallis Bamberg, PA-C  LO LOESTRIN FE 1 MG-10 MCG / 10 MCG tablet Take 1 tablet by mouth daily. 02/21/19   Cheral Marker, CNM  medroxyPROGESTERone (PROVERA) 10 MG tablet Take 1 tablet (10 mg total) by mouth daily. Patient not taking: Reported on 02/21/2019 01/17/19   Cheral Marker, CNM  methadone (DOLOPHINE) 10 MG/5ML solution Take 80 mg by mouth daily. She is currently titrating Methadone.    [provider]  predniSONE (DELTASONE) 20 MG tablet Take 2 tablets daily with breakfast. 12/15/21   Wallis Bamberg, PA-C  promethazine-dextromethorphan (PROMETHAZINE-DM) 6.25-15 MG/5ML syrup Take 5 mLs by mouth 4 (four) times daily as needed for cough. 12/15/21   Wallis Bamberg, PA-C      Allergies    Patient has no known allergies.    Review of Systems   Review of Systems  Physical Exam Updated Vital Signs BP 108/70 (BP Location: Right Arm)   Pulse 68   Temp 98.9 F (37.2 C) (Oral)   Resp 18   Ht 5\' 8"  (1.727 m)   Wt 60.8 kg   LMP 06/06/2023   SpO2 100%   BMI 20.37 kg/m  Physical Exam Vitals and nursing note reviewed.  Constitutional:      General: She is not in acute distress.    Appearance: She is well-developed.  HENT:     Head: Normocephalic and atraumatic.  Right Ear: Tympanic membrane normal.     Left Ear: Tympanic membrane normal.     Nose: Nose normal.     Mouth/Throat:     Mouth: Mucous membranes are moist.     Pharynx: Oropharynx is clear. No oropharyngeal exudate or posterior oropharyngeal erythema.  Eyes:     Extraocular Movements: Extraocular movements intact.     Conjunctiva/sclera: Conjunctivae normal.     Pupils: Pupils are equal, round, and reactive to light.  Cardiovascular:     Rate and Rhythm: Normal rate and regular rhythm.     Heart sounds: No murmur heard. Pulmonary:     Effort: Pulmonary effort is normal. No respiratory distress.     Breath sounds: No wheezing, rhonchi or rales.  Abdominal:     General: Abdomen is flat.      Palpations: Abdomen is soft.     Tenderness: There is no abdominal tenderness.  Musculoskeletal:        General: No swelling or tenderness. Normal range of motion.     Cervical back: Neck supple.  Skin:    General: Skin is warm and dry.     Capillary Refill: Capillary refill takes less than 2 seconds.  Neurological:     General: No focal deficit present.     Mental Status: She is alert and oriented to person, place, and time.  Psychiatric:        Mood and Affect: Mood normal.     ED Results / Procedures / Treatments   Labs (all labs ordered are listed, but only abnormal results are displayed) Labs Reviewed  COMPREHENSIVE METABOLIC PANEL - Abnormal; Notable for the following components:      Result Value   Glucose, Bld 108 (*)    AST 14 (*)    All other components within normal limits  URINALYSIS, ROUTINE W REFLEX MICROSCOPIC - Abnormal; Notable for the following components:   APPearance HAZY (*)    Hgb urine dipstick SMALL (*)    Bacteria, UA MANY (*)    All other components within normal limits  SARS CORONAVIRUS 2 BY RT PCR  CULTURE, BLOOD (ROUTINE X 2)  CULTURE, BLOOD (ROUTINE X 2)  URINE CULTURE  CBC WITH DIFFERENTIAL/PLATELET  LACTIC ACID, PLASMA  TSH  PREGNANCY, URINE    EKG None  Radiology DG Chest 2 View  Result Date: 06/22/2023 CLINICAL DATA:  Fever. EXAM: CHEST - 2 VIEW COMPARISON:  Chest radiograph 05/13/2018. FINDINGS: No consolidation or pulmonary edema. Normal heart size and mediastinal contours. No pleural effusion or pneumothorax. Visualized bones and upper abdomen are unremarkable. IMPRESSION: No evidence of acute cardiopulmonary disease. Electronically Signed   By: Orvan Falconer M.D.   On: 06/22/2023 14:23    Procedures Procedures    Medications Ordered in ED Medications  lactated ringers bolus 1,000 mL (0 mLs Intravenous Stopped 06/22/23 1705)    ED Course/ Medical Decision Making/ A&P                                 Medical Decision  Making This patient presents to the ED for concern of blood pressure, night sweats for the past couple of months, fever for the past several days, this involves an extensive number of treatment options, and is a complaint that carries with it a high risk of complications and morbidity.  The differential diagnosis includes pneumonia, sepsis, dehydration, electrolyte abnormality, thyroid disease, viral syndrome, pneumonia, UTI malignancy, other  Co morbidities that complicate the patient evaluation :   History of opiate use currently maintained on Suboxone   Additional history obtained:  Additional history obtained from EMR External records from outside source obtained and reviewed including notes   Lab Tests:  I Ordered, and personally interpreted labs.  The pertinent results include: Lactic acid is normal, patient is not pregnant, CMP normal, CBC is normal, COVID is negative, urinalysis is normal, TSH is normal   Imaging Studies ordered:  I ordered imaging studies including chest x-ray which shows no pulmonary edema or infiltrates I independently visualized and interpreted imaging within scope of identifying emergent findings  I agree with the radiologist interpretation   Cardiac Monitoring: / EKG:  The patient was maintained on a cardiac monitor.  I personally viewed and interpreted the cardiac monitored which showed an underlying rhythm of: sinus rhythm Problem List / ED Course / Critical interventions / Medication management  Has been having ongoing weeks of reported night sweats and fevers at home, afebrile here, labs are overall very reassuring.  No source of infection, she has not been hypotensive she is not orthostatic advised on follow-up and return precautions.  I have reviewed the patients home medicines and have made adjustments as needed    Amount and/or Complexity of Data Reviewed Labs: ordered. Radiology: ordered.           Final Clinical Impression(s)  / ED Diagnoses Final diagnoses:  Fever, unspecified fever cause    Rx / DC Orders ED Discharge Orders     None         Josem Kaufmann 06/22/23 2249    Derwood Kaplan, MD 06/23/23 1536

## 2023-06-22 NOTE — ED Triage Notes (Signed)
Pt presents with evaluation of hypotension and low HR for over 3-4 months, pt has been taking Suboxone for last 9 weeks, bp issues before starting medication, also has been losing weight and having fevers, highest at home 102.1, last dose of Motrin approx 3 hrs ago.

## 2023-06-23 LAB — BLOOD CULTURE ID PANEL (REFLEXED) - BCID2

## 2023-06-23 NOTE — ED Notes (Signed)
Called pt to check on pt status, no answer, left a VM and informed to call back at her earliest convenience

## 2023-06-23 NOTE — ED Notes (Signed)
Received positive BC result from lab with the organism being staph; informed Dr. Maple Hudson who stated to call pt and find out if she is worse or better; called pt and left VM to call back; will try again later

## 2023-06-25 LAB — CULTURE, BLOOD (ROUTINE X 2)

## 2023-06-25 LAB — URINE CULTURE: Culture: >=100000 — AB

## 2023-06-26 ENCOUNTER — Telehealth (HOSPITAL_BASED_OUTPATIENT_CLINIC_OR_DEPARTMENT_OTHER): Payer: Self-pay

## 2023-06-26 NOTE — Telephone Encounter (Signed)
Post ED Visit - Positive Culture Follow-up: Unsuccessful Patient Follow-up  Culture assessed and recommendations reviewed by:  [x]  Ruben Im, Pharm.D. []  Celedonio Miyamoto, [address].D., BCPS AQ-ID []  Garvin Fila, Pharm.D., BCPS []  Georgina Pillion, Pharm.D., BCPS []  [place], [address].D., BCPS, AAHIVP []  Estella Husk, Pharm.D., BCPS, AAHIVP []  Sherlynn Carbon, PharmD []  Pollyann Samples, PharmD, BCPS  Positive Blood culture  [x]  Patient discharged without antimicrobial prescription and treatment is now indicated []  Organism is resistant to prescribed ED discharge antimicrobial []  Patient with positive blood cultures  Plan: positive blood cx results pt needs to be seen for follow up. per ED provider Pricilla Loveless, MD   Unable to contact patient after 3 attempts, letter will be sent to address on file  [name] 06/26/2023, 10:52 AM

## 2023-06-27 LAB — CULTURE, BLOOD (ROUTINE X 2)
Culture: NO GROWTH
Special Requests: ADEQUATE

## 2023-07-18 ENCOUNTER — Ambulatory Visit: Payer: MEDICAID | Admitting: Family Medicine

## 2023-12-28 ENCOUNTER — Other Ambulatory Visit: Payer: Self-pay | Admitting: Obstetrics and Gynecology

## 2023-12-28 ENCOUNTER — Emergency Department (HOSPITAL_BASED_OUTPATIENT_CLINIC_OR_DEPARTMENT_OTHER): Payer: Self-pay

## 2023-12-28 ENCOUNTER — Encounter (HOSPITAL_BASED_OUTPATIENT_CLINIC_OR_DEPARTMENT_OTHER): Payer: Self-pay | Admitting: Emergency Medicine

## 2023-12-28 ENCOUNTER — Other Ambulatory Visit: Payer: Self-pay

## 2023-12-28 ENCOUNTER — Observation Stay (HOSPITAL_BASED_OUTPATIENT_CLINIC_OR_DEPARTMENT_OTHER)
Admission: EM | Admit: 2023-12-28 | Discharge: 2023-12-29 | Disposition: A | Discharge status: Home / Self Care | Payer: Self-pay | Attending: Obstetrics and Gynecology | Admitting: Obstetrics and Gynecology

## 2023-12-28 DIAGNOSIS — R102 Pelvic and perineal pain: Principal | ICD-10-CM | POA: Insufficient documentation

## 2023-12-28 DIAGNOSIS — K661 Hemoperitoneum: Secondary | ICD-10-CM | POA: Diagnosis not present

## 2023-12-28 DIAGNOSIS — F17210 Nicotine dependence, cigarettes, uncomplicated: Secondary | ICD-10-CM | POA: Insufficient documentation

## 2023-12-28 DIAGNOSIS — N83209 Unspecified ovarian cyst, unspecified side: Secondary | ICD-10-CM | POA: Diagnosis present

## 2023-12-28 DIAGNOSIS — D649 Anemia, unspecified: Secondary | ICD-10-CM | POA: Diagnosis not present

## 2023-12-28 DIAGNOSIS — R7309 Other abnormal glucose: Secondary | ICD-10-CM | POA: Diagnosis not present

## 2023-12-28 DIAGNOSIS — Z79899 Other long term (current) drug therapy: Secondary | ICD-10-CM | POA: Diagnosis not present

## 2023-12-28 DIAGNOSIS — R739 Hyperglycemia, unspecified: Secondary | ICD-10-CM

## 2023-12-28 DIAGNOSIS — D62 Acute posthemorrhagic anemia: Secondary | ICD-10-CM | POA: Diagnosis present

## 2023-12-28 HISTORY — DX: Hemoperitoneum: K66.1

## 2023-12-28 LAB — COMPREHENSIVE METABOLIC PANEL WITH GFR
ALT: 10 U/L (ref 0–44)
AST: 14 U/L — ABNORMAL LOW (ref 15–41)
Albumin: 3.8 g/dL (ref 3.5–5.0)
Alkaline Phosphatase: 45 U/L (ref 38–126)
Anion gap: 8 (ref 5–15)
BUN: 18 mg/dL (ref 6–20)
CO2: 22 mmol/L (ref 22–32)
Calcium: 8.7 mg/dL — ABNORMAL LOW (ref 8.9–10.3)
Chloride: 108 mmol/L (ref 98–111)
Creatinine, Ser: 0.8 mg/dL (ref 0.44–1.00)
GFR, Estimated: >60 mL/min (ref >60)
Glucose, Bld: 112 mg/dL — ABNORMAL HIGH (ref 70–99)
Potassium: 3.8 mmol/L (ref 3.5–5.1)
Sodium: 138 mmol/L (ref 135–145)
Total Bilirubin: 1 mg/dL (ref 0.0–1.2)
Total Protein: 6.3 g/dL — ABNORMAL LOW (ref 6.5–8.1)

## 2023-12-28 LAB — CBC WITH DIFFERENTIAL/PLATELET
Abs Immature Granulocytes: 0.03 10*3/uL (ref 0.00–0.07)
Basophils Absolute: 0.1 10*3/uL (ref 0.0–0.1)
Basophils Relative: 0 %
Eosinophils Absolute: 0 10*3/uL (ref 0.0–0.5)
Eosinophils Relative: 0 %
HCT: 33.9 % — ABNORMAL LOW (ref 36.0–46.0)
Hemoglobin: 11.7 g/dL — ABNORMAL LOW (ref 12.0–15.0)
Immature Granulocytes: 0 %
Lymphocytes Relative: 27 %
Lymphs Abs: 3.8 10*3/uL (ref 0.7–4.0)
MCH: 32.1 pg (ref 26.0–34.0)
MCHC: 34.5 g/dL (ref 30.0–36.0)
MCV: 92.9 fL (ref 80.0–100.0)
Monocytes Absolute: 0.9 10*3/uL (ref 0.1–1.0)
Monocytes Relative: 6 %
Neutro Abs: 9.3 10*3/uL — ABNORMAL HIGH (ref 1.7–7.7)
Neutrophils Relative %: 67 %
Platelets: 230 10*3/uL (ref 150–400)
RBC: 3.65 MIL/uL — ABNORMAL LOW (ref 3.87–5.11)
RDW: 12.5 % (ref 11.5–15.5)
WBC: 14.1 10*3/uL — ABNORMAL HIGH (ref 4.0–10.5)
nRBC: 0 % (ref 0.0–0.2)

## 2023-12-28 LAB — URINALYSIS, ROUTINE W REFLEX MICROSCOPIC
Bilirubin Urine: NEGATIVE
Glucose, UA: NEGATIVE mg/dL
Hgb urine dipstick: NEGATIVE
Ketones, ur: 15 mg/dL — AB
Leukocytes,Ua: NEGATIVE
Nitrite: NEGATIVE
Protein, ur: 30 mg/dL — AB
Specific Gravity, Urine: >1.046 — ABNORMAL HIGH (ref 1.005–1.030)
pH: 7 (ref 5.0–8.0)

## 2023-12-28 LAB — WET PREP, GENITAL
Clue Cells Wet Prep HPF POC: NONE SEEN
Sperm: NONE SEEN
Trich, Wet Prep: NONE SEEN
WBC, Wet Prep HPF POC: <10 (ref <10)
Yeast Wet Prep HPF POC: NONE SEEN

## 2023-12-28 LAB — LIPASE, BLOOD: Lipase: 18 U/L (ref 11–51)

## 2023-12-28 LAB — CBC
HCT: 32.5 % — ABNORMAL LOW (ref 36.0–46.0)
Hemoglobin: 10.9 g/dL — ABNORMAL LOW (ref 12.0–15.0)
MCH: 31.7 pg (ref 26.0–34.0)
MCHC: 33.5 g/dL (ref 30.0–36.0)
MCV: 94.5 fL (ref 80.0–100.0)
Platelets: 189 10*3/uL (ref 150–400)
RBC: 3.44 MIL/uL — ABNORMAL LOW (ref 3.87–5.11)
RDW: 12.4 % (ref 11.5–15.5)
WBC: 8.5 10*3/uL (ref 4.0–10.5)
nRBC: 0 % (ref 0.0–0.2)

## 2023-12-28 LAB — HEMOGLOBIN AND HEMATOCRIT, BLOOD
HCT: 30.1 % — ABNORMAL LOW (ref 36.0–46.0)
HCT: 27.9 % — ABNORMAL LOW (ref 36.0–46.0)
Hemoglobin: 10.2 g/dL — ABNORMAL LOW (ref 12.0–15.0)
Hemoglobin: 9.8 g/dL — ABNORMAL LOW (ref 12.0–15.0)

## 2023-12-28 LAB — PREGNANCY, URINE: Preg Test, Ur: NEGATIVE

## 2023-12-28 LAB — TYPE AND SCREEN
ABO/RH(D): O POS
Antibody Screen: NEGATIVE

## 2023-12-28 LAB — HCG, SERUM, QUALITATIVE: Preg, Serum: NEGATIVE

## 2023-12-28 LAB — HIV ANTIBODY (ROUTINE TESTING W REFLEX): HIV Screen 4th Generation wRfx: NONREACTIVE

## 2023-12-28 MED ORDER — MORPHINE SULFATE (PF) 4 MG/ML IV SOLN
4.0000 mg | Freq: Once | INTRAVENOUS | Status: AC
  Administered 2023-12-28: 4 mg via INTRAVENOUS
  Filled 2023-12-28: qty 1

## 2023-12-28 MED ORDER — SODIUM CHLORIDE 0.9% FLUSH
3.0000 mL | INTRAVENOUS | Status: DC | PRN
Start: 2023-12-28 — End: 2023-12-29

## 2023-12-28 MED ORDER — SIMETHICONE 80 MG PO CHEW: 80.0000 mg | CHEWABLE_TABLET | Freq: Four times a day (QID) | ORAL | Status: DC | PRN

## 2023-12-28 MED ORDER — ALUM & MAG HYDROXIDE-SIMETH 200-200-20 MG/5ML PO SUSP: 30.0000 mL | ORAL | Status: DC | PRN

## 2023-12-28 MED ORDER — ONDANSETRON HCL 4 MG PO TABS: 4.0000 mg | ORAL_TABLET | Freq: Four times a day (QID) | ORAL | Status: DC | PRN

## 2023-12-28 MED ORDER — IOHEXOL 300 MG/ML  SOLN
100.0000 mL | Freq: Once | INTRAMUSCULAR | Status: AC | PRN
  Administered 2023-12-28: 100 mL via INTRAVENOUS

## 2023-12-28 MED ORDER — POLYETHYLENE GLYCOL 3350 17 G PO PACK: 17.0000 g | PACK | Freq: Every day | ORAL | Status: DC | PRN

## 2023-12-28 MED ORDER — OXYCODONE HCL 5 MG PO TABS
5.0000 mg | ORAL_TABLET | Freq: Four times a day (QID) | ORAL | Status: DC | PRN
  Administered 2023-12-28 – 2023-12-29 (×5): 10 mg via ORAL
  Filled 2023-12-28 (×2): qty 2
  Filled 2023-12-28: qty 1
  Filled 2023-12-28 (×2): qty 2
  Filled 2023-12-28: qty 1

## 2023-12-28 MED ORDER — ACETAMINOPHEN 325 MG PO TABS
650.0000 mg | ORAL_TABLET | Freq: Four times a day (QID) | ORAL | Status: DC | PRN
  Administered 2023-12-29 (×2): 650 mg via ORAL
  Filled 2023-12-28 (×2): qty 2

## 2023-12-28 MED ORDER — ONDANSETRON HCL 4 MG/2ML IJ SOLN
4.0000 mg | Freq: Once | INTRAMUSCULAR | Status: AC
  Administered 2023-12-28: 4 mg via INTRAVENOUS
  Filled 2023-12-28: qty 2

## 2023-12-28 MED ORDER — SODIUM CHLORIDE 0.9% FLUSH
3.0000 mL | Freq: Two times a day (BID) | INTRAVENOUS | Status: DC
  Administered 2023-12-28 (×2): 3 mL via INTRAVENOUS

## 2023-12-28 MED ORDER — SODIUM CHLORIDE 0.9 % IV SOLN: 250.0000 mL | INTRAVENOUS | Status: AC | PRN

## 2023-12-28 MED ORDER — ONDANSETRON HCL 4 MG/2ML IJ SOLN: 4.0000 mg | Freq: Four times a day (QID) | INTRAMUSCULAR | Status: DC | PRN

## 2023-12-28 MED ORDER — PRENATAL MULTIVITAMIN CH
1.0000 | ORAL_TABLET | Freq: Every day | ORAL | Status: DC
  Administered 2023-12-28: 1 via ORAL
  Filled 2023-12-28: qty 1

## 2023-12-28 NOTE — ED Provider Notes (Signed)
 Patient was patiently seen by Dr. Preston Fleeting.  Please see his note. Patient presented to the ED with complaints of abdominal pain.  Patient is not pregnant.  [name] initial CT scan was notable for moderate to large volume free fluid in the abdomen and pelvis.  Suggestive of hemoperitoneum.  Patient was noted to have prominent soft tissue in the left adnexal space.  Pelvic ultrasound recommended.  The pelvic ultrasound shows large volume complex free fluid in the pelvis with apparent clot in the left adnexal space that is encasing the left ovary no findings to suggest ovarian torsion.  No adnexal mass.  No definite tubo-ovarian abscess.  Clinical Course as of 12/28/23 0929  Thu Dec 28, 2023  0834 Case discussed with Dr Vergie Living, OB GYN.  Recommends repeat CBC to make sure it is stable to determine disposition. [JK]  0912 CBC(!) Hemoglobin decreased to 10.9 [JK]    Clinical Course User Index [JK] Linwood Dibbles, MD   Reviewed case with Dr Vergie Living after repeat HGB.  Will admit for obs, pain management, serial hgb.  Pt is ok for clear liquids.  Repeat hgb at Carmelina Peal, MD 12/28/23 [phone]

## 2023-12-28 NOTE — ED Notes (Signed)
 Report given to [facility].

## 2023-12-28 NOTE — ED Notes (Signed)
 Pt aware of the need for a urine... Unable to currently provide the sample.Marland KitchenMarland Kitchen

## 2023-12-28 NOTE — ED Notes (Signed)
Patient taken to Ultrasound at this time.

## 2023-12-28 NOTE — ED Triage Notes (Signed)
 Patient coming to ED with complaints of pelvic pain starting tonight.  Denies dysuria or vaginal bleeding.

## 2023-12-28 NOTE — ED Notes (Signed)
 Lab called to make aware of add on for pregnancy

## 2023-12-28 NOTE — ED Notes (Signed)
 Report given to the floor RN.

## 2023-12-28 NOTE — Plan of Care (Signed)
   Problem: Education: Goal: Knowledge of General Education information will improve Description: Including pain rating scale, medication(s)/side effects and non-pharmacologic comfort measures Outcome: Completed/Met

## 2023-12-28 NOTE — ED Provider Notes (Signed)
  EMERGENCY DEPARTMENT AT [facility] Provider Note   CSN: [id number] Arrival date & time: 12/28/23  0145     History  Chief complaint: Abdominal pain  Tracy Stout is a 28 y.o. female.  The history is provided by the patient.   She has history of bipolar disorder and comes in because of severe suprapubic pain which came on briefly this afternoon that resolved and then recurred at 9:45 PM.  She has had severe pain since then.  She denies nausea or vomiting and denies constipation or diarrhea.  She does endorse some dysuria.  Last menses was 3 weeks ago and normal.  She states that she is not using contraception but does not think she is pregnant.  She denies any vaginal discharge.   Home Medications Prior to Admission medications   Medication Sig Start Date End Date Taking? Authorizing Provider  aspirin-acetaminophen-caffeine (EXCEDRIN MIGRAINE) [phone] MG tablet Take 1 tablet by mouth every 6 (six) hours as needed for headache or migraine. 12/15/21   Wallis Bamberg, PA-C  levocetirizine (XYZAL) 5 MG tablet Take 1 tablet (5 mg total) by mouth every evening. 12/15/21   Wallis Bamberg, PA-C  LO LOESTRIN FE 1 MG-10 MCG / 10 MCG tablet Take 1 tablet by mouth daily. 02/21/19   Cheral Marker, CNM  medroxyPROGESTERone (PROVERA) 10 MG tablet Take 1 tablet (10 mg total) by mouth daily. Patient not taking: Reported on 02/21/2019 01/17/19   Cheral Marker, CNM  methadone (DOLOPHINE) 10 MG/5ML solution Take 80 mg by mouth daily. She is currently titrating Methadone.    [provider]  predniSONE (DELTASONE) 20 MG tablet Take 2 tablets daily with breakfast. 12/15/21   Wallis Bamberg, PA-C  promethazine-dextromethorphan (PROMETHAZINE-DM) 6.25-15 MG/5ML syrup Take 5 mLs by mouth 4 (four) times daily as needed for cough. 12/15/21   Wallis Bamberg, PA-C      Allergies    Patient has no known allergies.    Review of Systems   Review of Systems  All other systems reviewed and are  negative.   Physical Exam Updated Vital Signs BP 90/63   Pulse 66   Temp 98.2 F (36.8 C) (Oral)   Resp 18   Ht 5\' 8"  (1.727 m)   Wt 77.1 kg   LMP 11/13/2023 (Exact Date)   SpO2 100%   BMI 25.85 kg/m  Physical Exam Vitals and nursing note reviewed.   28 year old female, obviously uncomfortable, but is in no acute distress. Vital signs are normal. Oxygen saturation is 100%, which is normal. Head is normocephalic and atraumatic. PERRLA, EOMI.  Back is nontender and there is no CVA tenderness. Lungs are clear without rales, wheezes, or rhonchi. Chest is nontender. Heart has regular rate and rhythm without murmur. Abdomen is soft, flat, with marked tenderness across the suprapubic area although somewhat worse on the right. Neurologic: Mental status is normal, moves all extremities equally.  ED Results / Procedures / Treatments   Labs (all labs ordered are listed, but only abnormal results are displayed) Labs Reviewed  COMPREHENSIVE METABOLIC PANEL WITH GFR - Abnormal; Notable for the following components:      Result Value   Glucose, Bld 112 (*)    Calcium 8.7 (*)    Total Protein 6.3 (*)    AST 14 (*)    All other components within normal limits  CBC WITH DIFFERENTIAL/PLATELET - Abnormal; Notable for the following components:   WBC 14.1 (*)    RBC 3.65 (*)  Hemoglobin 11.7 (*)    HCT 33.9 (*)    Neutro Abs 9.3 (*)    All other components within normal limits  LIPASE, BLOOD  HCG, SERUM, QUALITATIVE  PREGNANCY, URINE  URINALYSIS, ROUTINE W REFLEX MICROSCOPIC    EKG None  Radiology CT ABDOMEN PELVIS W CONTRAST Result Date: 12/28/2023 CLINICAL DATA:  Right lower quadrant abdominal pain.  Pelvic pain. EXAM: CT ABDOMEN AND PELVIS WITH CONTRAST TECHNIQUE: Multidetector CT imaging of the abdomen and pelvis was performed using the standard protocol following bolus administration of intravenous contrast. RADIATION DOSE REDUCTION: This exam was performed according to the  departmental dose-optimization program which includes automated exposure control, adjustment of the mA and/or kV according to patient size and/or use of iterative reconstruction technique. CONTRAST:  OMNIPAQUE IOHEXOL 300 MG/ML  SOLN COMPARISON:  10/09/2016 FINDINGS: Lower chest: No acute findings. Hepatobiliary: No suspicious focal abnormality within the liver parenchyma. There is no evidence for gallstones, gallbladder wall thickening, or pericholecystic fluid. No intrahepatic or extrahepatic biliary dilation. Pancreas: No focal mass lesion. No dilatation of the main duct. No intraparenchymal cyst. No peripancreatic edema. Spleen: No splenomegaly. No suspicious focal mass lesion. Adrenals/Urinary Tract: No adrenal nodule or mass. Kidneys unremarkable. No evidence for hydroureter. The urinary bladder appears normal for the degree of distention. Stomach/Bowel: Stomach is unremarkable. No gastric wall thickening. No evidence of outlet obstruction. Duodenum is normally positioned as is the ligament of Treitz. No small bowel wall thickening. No small bowel dilatation. The terminal ileum is normal. The appendix is not well visualized, but there is no edema or inflammation in the region of the cecal tip to suggest appendicitis. No gross colonic mass. No colonic wall thickening. Vascular/Lymphatic: No abdominal aortic aneurysm. No abdominal aortic atherosclerotic calcification. There is no gastrohepatic or hepatoduodenal ligament lymphadenopathy. No retroperitoneal or mesenteric lymphadenopathy. No pelvic sidewall lymphadenopathy. Reproductive: The uterus is unremarkable. Neither ovary is discretely visible due to prominent free fluid in the pelvis. There does appear to be a 3.1 x 2.9 cm soft tissue structure adjacent to the left psoas muscle on 58/2 which could be a high left ovary. The left gonadal vessels do appear to extend into the inferior aspect of this finding. Other: Moderate to large volume free fluid in  the abdomen and pelvis. Fluid adjacent to the liver and spleen has attenuation higher than would be expected for simple fluid. The fluid in the low pelvis (see image 71/2) has relatively high density consistent with blood products/clot or infectious debris. There is probably some layering clot in the left paracolic gutter (78/2). High density fluid is also seen in the anterior pelvis. Musculoskeletal: No worrisome lytic or sclerotic osseous abnormality. IMPRESSION: 1. Moderate to large volume free fluid in the abdomen and pelvis. Fluid adjacent to the liver and spleen has attenuation higher than would be expected for simple fluid. The fluid in the low pelvis has relatively high density consistent with blood products/clot or infectious debris with some probable clot layering in the left paracolic gutter. Generally, by CT imaging, appearance is most suggestive of hemoperitoneum. 2. Neither ovary is discretely visible due to prominent free fluid in the pelvis. There does appear to be prominent soft tissue in the left adnexal space with a 3.1 x 2.9 cm soft tissue structure adjacent to the left psoas muscle which could be a high left ovary. In the absence of pregnancy, the main differential considerations would include ovarian torsion, ruptured hemorrhagic cyst, or tubo-ovarian abscess. Pelvic ultrasound recommended to further evaluate.  Findings were discussed with Dr. Preston Fleeting at approximately 0617 hours on 12/28/2023. Electronically Signed   By: Kennith Center M.D.   On: 12/28/2023 06:19    Procedures Procedures    Medications Ordered in ED Medications  morphine (PF) 4 MG/ML injection 4 mg (has no administration in time range)  morphine (PF) 4 MG/ML injection 4 mg (4 mg Intravenous Given 12/28/23 0326)  ondansetron (ZOFRAN) injection 4 mg (4 mg Intravenous Given 12/28/23 0326)  iohexol (OMNIPAQUE) 300 MG/ML solution 100 mL (100 mLs Intravenous Contrast Given 12/28/23 0547)    ED Course/ Medical Decision Making/  A&P                                 Medical Decision Making Amount and/or Complexity of Data Reviewed Labs: ordered. Radiology: ordered.  Risk Prescription drug management.   Severe suprapubic pain.  This is a condition with entails a great number of treatment options and includes significant risk for morbidity and complications.  Differential diagnosis includes, but is not limited to, ectopic pregnancy, ruptured ovarian cyst, urolithiasis, urinary tract infection, appendicitis, diverticulitis.  I have ordered morphine for pain and I have ordered laboratory workup of CBC and comprehensive metabolic panel and lipase as well as urinalysis and urine pregnancy test.  I am ordering CT of abdomen and pelvis.  I have reviewed her laboratory results and my interpretation is mildly elevated random glucose level which will need to be followed as an outpatient, normal lipase, mild anemia which is new compared with 06/22/2023, negative pregnancy test.  CT scan shows moderate to large volume free fluid in the abdomen and pelvis with attenuation suggesting either blood products or infectious debris, prominent soft tissue in the left adnexal space with 3.1 x 2.9 cm soft tissue structure adjacent to the left psoas muscle which could be a high left ovary.  I have independently viewed the images, and agree with the radiologist's interpretation.  I have discussed the findings with the radiologist.  Differential diagnosis includes ovarian torsion, ruptured hemorrhagic cyst, tubo-ovarian abscess.  I am ordering pelvic ultrasound to further delineate this.  Patient had modest relief of pain with morphine, I have ordered additional morphine.  Case is signed out to Dr. Lynelle Doctor, oncoming physician.  Final Clinical Impression(s) / ED Diagnoses Final diagnoses:  Pelvic pain in female  Hemoperitoneum  Normochromic normocytic anemia  Elevated random blood glucose level    Rx / DC Orders ED Discharge Orders     None          Dione Booze, MD 12/28/23 [phone]

## 2023-12-28 NOTE — H&P (Signed)
 Obstetrics & Gynecology H&P   Date of Admission: 12/28/2023   Requesting Provider: [facility]  Primary OBGYN: None Primary Care Provider: Patient, No Pcp Per  Reason for Admission: hemoperitoneum, likely hemorrhagic ovarian cyst.   History of Present Illness: [name] is a 28 y.o. G1P1001 (LMP: 12/01/23), with the above CC.  Patient states that she had sudden onset mid lower abdominal pain that occurred at 2145 last night that felt sharp, stabbing She came to the ED for worsening and persistent s/s.   ED work up remarkable for hemoperitoneum and slight anemia with negative serum pregnancy test. 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  OBGYN History: As per HPI. OB History  Gravida Para Term Preterm AB Living  1 1 1   1   SAB IAB Ectopic Multiple Live Births     0 1    # Outcome Date GA Lbr Len/2nd Weight Sex Type Anes PTL Lv  1 Term 03/05/17 [redacted]w[redacted]d 04:22 / 01:46 4289 g F Vag-Vacuum EPI  LIV   Periods: qmonth, regular, 5-7d History of STIs: No She is currently using  nothing  for contraception.    Past Medical History: Past Medical History:  Diagnosis Date   ADHD (attention deficit hyperactivity disorder)    Anxiety    Bipolar 1  disorder (HCC)    Borderline personality disorder (HCC)    Depression    History of pyelonephritis during pregnancy 10/06/2016   Admitted 10/06/16> progressed to pneumonia/ARDS> tx to [facility], on ventilator for few days, d/c'd 10/15/16       On Keflex suppression     Pneumonia    Polysubstance abuse (HCC)    PTSD (post-traumatic stress disorder)    Self-inflicted injury    Suicide attempt [facility])     Past Surgical History: Past Surgical History:  Procedure Laterality Date   tubes in ears      Family History:  Family History  Problem Relation Age of Onset   Hypertension Paternal Grandmother    Bipolar disorder Paternal Grandmother    Depression Maternal Grandmother    Stroke Maternal Grandfather        X 5   Bipolar disorder Father    ADD / ADHD Father    Hypertension Father    Drug abuse Father        drug overdose   Depression Father    Depression Mother    ADD / ADHD Brother    Bipolar disorder Sister    Depression Sister    ADD / ADHD Sister    Other Sister        heart condition; had open heart surgery   Social History:  Social History   Socioeconomic History   Marital status: Single    Spouse  name: Not on file   Number of children: 1   Years of education: Not on file   Highest education level: Not on file  Occupational History   Not on file  Tobacco Use   Smoking status: Every Day    Current packs/day: 1.00    Average packs/day: 1 pack/day for 3.0 years (3.0 ttl pk-yrs)    Types: Cigarettes   Smokeless tobacco: Never  Vaping Use   Vaping status: Never Used  Substance and Sexual Activity   Alcohol use: Not Currently   Drug use: Yes    Comment: uses marijuana and was clean of opiates for 15months until today   Sexual activity: Yes    Birth control/protection: None  Other Topics Concern   Not on file  Social History Narrative   Not on file   Social Drivers of Health   Financial Resource Strain: Not on file  Food Insecurity: No Food Insecurity  (12/28/2023)   Hunger Vital Sign    Worried About Running Out of Food in the Last Year: Never true    Ran Out of Food in the Last Year: Never true  Transportation Needs: No Transportation Needs (12/28/2023)   PRAPARE - Administrator, Civil Service (Medical): No    Lack of Transportation (Non-Medical): No  Physical Activity: Not on file  Stress: Not on file  Social Connections: Not on file  Intimate Partner Violence: Not At Risk (12/28/2023)   Humiliation, Afraid, Rape, and Kick questionnaire    Fear of Current or Ex-Partner: No    Emotionally Abused: No    Physically Abused: No    Sexually Abused: No    Allergy: No Known Allergies  Current Outpatient Medications: None   Hospital Medications: Current Facility-Administered Medications  Medication Dose Route Frequency Provider Last Rate Last Admin   0.9 %  sodium chloride infusion  250 mL Intravenous PRN [place] Bing, MD       acetaminophen (TYLENOL) tablet 650 mg  650 mg Oral Q6H PRN [place] Bing, MD       alum & mag hydroxide-simeth (MAALOX/MYLANTA) 200-200-20 MG/5ML suspension 30 mL  30 mL Oral Q4H PRN [place] Bing, MD       ondansetron (ZOFRAN) tablet 4 mg  4 mg Oral Q6H PRN [place] Bing, MD       Or   ondansetron (ZOFRAN) injection 4 mg  4 mg Intravenous Q6H PRN [place] Bing, MD       oxyCODONE (Oxy IR/ROXICODONE) immediate release tablet 5-10 mg  5-10 mg Oral Q6H PRN [place] Bing, MD   10 mg at 12/28/23 1236   polyethylene glycol (MIRALAX / GLYCOLAX) packet 17 g  17 g Oral Daily PRN [place] Bing, MD       prenatal multivitamin tablet 1 tablet  1 tablet Oral Q1200 [place] Bing, MD       simethicone (MYLICON) chewable tablet 80 mg  80 mg Oral QID PRN [place] Bing, MD       sodium chloride flush (NS) 0.9 % injection 3 mL  3 mL Intravenous Q12H [place] Bing, MD       sodium chloride flush (NS) 0.9 % injection 3 mL  3 mL Intravenous PRN [place] Bing, MD         Physical  Exam:  Current Vital Signs 24h Vital Sign Ranges  T 98.2 F (36.8 C) Temp  Avg: 98.2 F (36.8 C)  Min: 98.2 F (36.8 C)  Max: 98.3 F (36.8 C)  BP 102/66 BP  Min: 90/63  Max: 118/72  HR 82 Pulse  Avg: 73.1  Min: 65  Max: 95  RR 18 Resp  Avg: 17.3  Min: 16  Max: 20  SaO2 100 % Room Air SpO2  Avg: 99.8 %  Min: 97 %  Max: 100 %       24 Hour I/O Current Shift I/O  Time Ins Outs No intake/output data recorded. No intake/output data recorded.   Patient Vitals for the past 24 hrs:  BP Temp Temp src Pulse Resp SpO2 Height Weight  12/28/23 1240 102/66 98.2 F (36.8 C) Oral 82 18 100 % -- --  12/28/23 1144 (!) 112/57 98.2 F (36.8 C) Oral 74 18 100 % -- --  12/28/23 1113 (!) 111/59 98.3 F (36.8 C) Oral 69 20 100 % -- --  12/28/23 1003 118/72 -- -- 65 -- 100 % -- --  12/28/23 0930 114/66 -- -- 72 16 100 % -- --  12/28/23 0800 (!) 101/54 -- -- 81 16 100 % -- --  12/28/23 0635 111/67 -- -- 84 16 100 % -- --  12/28/23 0530 90/63 -- -- 66 -- 100 % -- --  12/28/23 0500 104/80 -- -- 67 -- 100 % -- --  12/28/23 0445 118/61 -- -- 65 18 100 % -- --  12/28/23 0430 109/74 -- -- 72 -- 100 % -- --  12/28/23 0415 115/72 -- -- 66 -- 100 % -- --  12/28/23 0400 93/62 -- -- 70 -- 100 % -- --  12/28/23 0345 104/64 -- -- 68 -- 97 % -- --  12/28/23 0140 -- -- -- -- -- -- 5\' 8"  (1.727 m) 77.1 kg  12/28/23 0139 102/81 98.2 F (36.8 C) Oral 95 16 100 % -- --   Body mass index is 25.85 kg/m. General appearance: Well nourished, well developed female in no acute distress.  Cardiovascular: S1, S2 normal, no murmur, rub or gallop, regular rate and rhythm Respiratory:  Clear to auscultation bilateral. Normal respiratory effort Abdomen: soft, ttp in lower mid pelvis, no peritoneal s/s.  Neuro/Psych:  Normal mood and affect.  Skin:  Warm and dry.  Extremities: no clubbing, cyanosis, or edema.    Laboratory: As per HPI Recent Labs  Lab 12/28/23 0324 12/28/23 0856 12/28/23 1157  WBC 14.1* 8.5  --    HGB 11.7* 10.9* 10.2*  HCT 33.9* 32.5* 30.1*  PLT 230 189  --    Recent Labs  Lab 12/28/23 0324  NA 138  K 3.8  CL 108  CO2 22  BUN 18  CREATININE 0.80  CALCIUM 8.7*  PROT 6.3*  BILITOT 1.0  ALKPHOS 45  ALT 10  AST 14*  GLUCOSE 112*   No results for input(s): "APTT", "INR", "PTT" in the last 168 hours.  Invalid input(s): "DRHAPTT" Recent Labs  Lab 12/28/23 1142  ABORH PENDING   Imaging:  Narrative & Impression  CLINICAL DATA:  Right lower quadrant abdominal pain.  Pelvic pain.   EXAM: CT ABDOMEN AND PELVIS WITH CONTRAST   TECHNIQUE: Multidetector CT imaging of the abdomen and pelvis was performed using the standard protocol following bolus administration of intravenous contrast.   RADIATION DOSE REDUCTION: This exam was performed according to the departmental dose-optimization program which includes automated exposure control, adjustment of the mA and/or kV according to patient size and/or use of iterative reconstruction technique.   CONTRAST:  OMNIPAQUE IOHEXOL 300 MG/ML  SOLN   COMPARISON:  10/09/2016   FINDINGS: Lower chest: No  acute findings.   Hepatobiliary: No suspicious focal abnormality within the liver parenchyma. There is no evidence for gallstones, gallbladder wall thickening, or pericholecystic fluid. No intrahepatic or extrahepatic biliary dilation.   Pancreas: No focal mass lesion. No dilatation of the main duct. No intraparenchymal cyst. No peripancreatic edema.   Spleen: No splenomegaly. No suspicious focal mass lesion.   Adrenals/Urinary Tract: No adrenal nodule or mass. Kidneys unremarkable. No evidence for hydroureter. The urinary bladder appears normal for the degree of distention.   Stomach/Bowel: Stomach is unremarkable. No gastric wall thickening. No evidence of outlet obstruction. Duodenum is normally positioned as is the ligament of Treitz. No small bowel wall thickening. No small bowel dilatation. The terminal  ileum is normal. The appendix is not well visualized, but there is no edema or inflammation in the region of the cecal tip to suggest appendicitis. No gross colonic mass. No colonic wall thickening.   Vascular/Lymphatic: No abdominal aortic aneurysm. No abdominal aortic atherosclerotic calcification. There is no gastrohepatic or hepatoduodenal ligament lymphadenopathy. No retroperitoneal or mesenteric lymphadenopathy. No pelvic sidewall lymphadenopathy.   Reproductive: The uterus is unremarkable. Neither ovary is discretely visible due to prominent free fluid in the pelvis. There does appear to be a 3.1 x 2.9 cm soft tissue structure adjacent to the left psoas muscle on 58/2 which could be a high left ovary. The left gonadal vessels do appear to extend into the inferior aspect of this finding.   Other: Moderate to large volume free fluid in the abdomen and pelvis. Fluid adjacent to the liver and spleen has attenuation higher than would be expected for simple fluid. The fluid in the low pelvis (see image 71/2) has relatively high density consistent with blood products/clot or infectious debris. There is probably some layering clot in the left paracolic gutter (04/5). High density fluid is also seen in the anterior pelvis.   Musculoskeletal: No worrisome lytic or sclerotic osseous abnormality.   IMPRESSION: 1. Moderate to large volume free fluid in the abdomen and pelvis. Fluid adjacent to the liver and spleen has attenuation higher than would be expected for simple fluid. The fluid in the low pelvis has relatively high density consistent with blood products/clot or infectious debris with some probable clot layering in the left paracolic gutter. Generally, by CT imaging, appearance is most suggestive of hemoperitoneum. 2. Neither ovary is discretely visible due to prominent free fluid in the pelvis. There does appear to be prominent soft tissue in the left adnexal space with a 3.1  x 2.9 cm soft tissue structure adjacent to the left psoas muscle which could be a high left ovary. In the absence of pregnancy, the main differential considerations would include ovarian torsion, ruptured hemorrhagic cyst, or tubo-ovarian abscess. Pelvic ultrasound recommended to further evaluate.   Findings were discussed with Dr. Preston Fleeting at approximately 0617 hours on 12/28/2023.     Electronically Signed   By: Kennith Center M.D.   On: 12/28/2023 06:19   Narrative & Impression  CLINICAL DATA:  Pelvic pain. Hemoperitoneum on CT imaging earlier today. Negative pregnancy test.   EXAM: TRANSABDOMINAL ULTRASOUND OF PELVIS   DOPPLER ULTRASOUND OF OVARIES   TECHNIQUE: Transabdominal ultrasound examination of the pelvis was performed including evaluation of the uterus, ovaries, adnexal regions, and pelvic cul-de-sac.   Color and duplex Doppler ultrasound was utilized to evaluate blood flow to the ovaries.   COMPARISON:  CT scan from earlier same day   FINDINGS: Uterus   Measurements: 11.3 x 4.3 x 4.5  cm = volume: 113 mL. No fibroids or other mass visualized.   Endometrium   Thickness: 9 mm.  No focal abnormality visualized.   Right ovary   Measurements: 3.3 x 2.6 x 2.1 cm = volume: 9.3 mL. Normal appearance/no adnexal mass.   Left ovary   Measurements: 3.5 x 3.2 x 3.3 cm = volume: 19.4 mL. Left ovary is encased in echogenic material without demonstrable flow signal on Doppler imaging and likely representing blood clot.   Pulsed Doppler evaluation demonstrates normal low-resistance arterial and venous waveforms in both ovaries.   Other: Large volume complex free fluid identified in the pelvis, as noted above with apparent clot in the left adnexal space.   IMPRESSION: 1. Large volume complex free fluid in the pelvis with apparent clot in the left adnexal space, encasing the left ovary. Imaging features are compatible with hemoperitoneum. No features to suggest  left ovarian torsion given demonstrable low resistance arterial and venous blood flow identified in the left ovary on pulsed Doppler evaluation. No adnexal mass lesion evident. No definite tubo-ovarian abscess. Given the negative pregnancy test, ruptured ectopic pregnancy is considered unlikely.     Electronically Signed   By: Kennith Center M.D.   On: 12/28/2023 07:58   Assessment: patient stable  Plan: Okay to eat, regular diet ordered. Follow serial labs. PO PRNs for pain control, SCDs, OOB ad lib. Will obtain STD screening *Dispo: potentially tomorrow  Total time taking care of the patient was 35 minutes, with greater than 50% of the time spent in face to face interaction with the patient.  Cornelia Copa MD Attending Center for [facility] (Faculty Practice) GYN Consult Phone: [phone] (M-F, [phone]) & [phone] (Off hours, weekends, holidays)

## 2023-12-29 ENCOUNTER — Other Ambulatory Visit (HOSPITAL_COMMUNITY): Payer: Self-pay

## 2023-12-29 DIAGNOSIS — N83209 Unspecified ovarian cyst, unspecified side: Secondary | ICD-10-CM | POA: Diagnosis not present

## 2023-12-29 DIAGNOSIS — R188 Other ascites: Secondary | ICD-10-CM | POA: Diagnosis not present

## 2023-12-29 DIAGNOSIS — K661 Hemoperitoneum: Secondary | ICD-10-CM | POA: Diagnosis not present

## 2023-12-29 LAB — GC/CHLAMYDIA PROBE AMP (~~LOC~~) NOT AT ARMC
Chlamydia: NEGATIVE
Comment: NEGATIVE
Comment: NORMAL
Neisseria Gonorrhea: NEGATIVE

## 2023-12-29 LAB — HEMOGLOBIN AND HEMATOCRIT, BLOOD
HCT: 25.5 % — ABNORMAL LOW (ref 36.0–46.0)
HCT: 26 % — ABNORMAL LOW (ref 36.0–46.0)
Hemoglobin: 8.5 g/dL — ABNORMAL LOW (ref 12.0–15.0)
Hemoglobin: 8.8 g/dL — ABNORMAL LOW (ref 12.0–15.0)

## 2023-12-29 LAB — RPR: RPR Ser Ql: NONREACTIVE

## 2023-12-29 MED ORDER — POLYETHYLENE GLYCOL 3350 17 GM/SCOOP PO POWD
17.0000 g | Freq: Every day | ORAL | 1 refills | Status: AC
Start: 2023-12-29 — End: ?
  Filled 2023-12-29: qty 238, 14d supply, fill #0

## 2023-12-29 MED ORDER — SIMETHICONE 80 MG PO CHEW
80.0000 mg | CHEWABLE_TABLET | Freq: Four times a day (QID) | ORAL | 0 refills | Status: DC | PRN
  Filled 2023-12-29: qty 30, 8d supply, fill #0

## 2023-12-29 MED ORDER — OXYCODONE-ACETAMINOPHEN 5-325 MG PO TABS
1.0000 | ORAL_TABLET | Freq: Four times a day (QID) | ORAL | 0 refills | Status: DC | PRN
  Filled 2023-12-29: qty 20, 5d supply, fill #0

## 2023-12-29 MED ORDER — NORGESTIMATE-ETH ESTRADIOL 0.25-35 MG-MCG PO TABS: 1.0000 | ORAL_TABLET | Freq: Every day | ORAL | 6 refills | Status: DC

## 2023-12-29 MED ORDER — IBUPROFEN 400 MG PO TABS
400.0000 mg | ORAL_TABLET | Freq: Three times a day (TID) | ORAL | 0 refills | Status: DC | PRN
  Filled 2023-12-29: qty 30, 10d supply, fill #0

## 2023-12-29 NOTE — Progress Notes (Signed)
Discharge instructions and prescriptions given to pt. Discussed signs and symptoms to report to the MD, upcoming appointments, and meds. Pt verbalizes understanding and has no questions or concerns at this time. Pt discharged home from hospital in stable condition. 

## 2023-12-29 NOTE — Discharge Summary (Signed)
 Physician Discharge Summary  Patient ID: Tracy Stout MRN: [id number] DOB/AGE: [birth year] 27 y.o.  Admit date: 12/28/2023 Discharge date: 12/29/2023 OBGYN Clinic: None  Admission Diagnoses: Likely hemorrhagic ovarian cyst. Anemia. Hemoperitoneum. Abdominal pain.   Discharge Diagnoses: Same. Resolving pain  Discharged Condition: good  Hospital Course: 28 y/o G1P1 (LMP: 12/01/23).   Patient states that she had sudden onset mid lower abdominal pain that occurred at 2145 last night that felt sharp, stabbing She came to the ED for worsening and persistent s/s. Blood HCG negative.    ED work up remarkable for hemoperitoneum and slight anemia with negative serum pregnancy test. ED called for consult and patient admitted for serial labs and pain control   On day of discharge, patient was taking PO, had stabilized H/H with no s/s of anemia and pain controlled. Patient desired to start OCPs again; Sprintec again. Appt with [facility] made for patient.      Latest Ref Rng & Units 12/29/2023    5:50 AM 12/29/2023   12:32 AM 12/28/2023    5:45 PM  CBC  Hemoglobin 12.0 - 15.0 g/dL 8.8  8.5  9.8   Hematocrit 36.0 - 46.0 % 26.0  25.5  27.9    Imaging:  Narrative & Impression  CLINICAL DATA:  Right lower quadrant abdominal pain.  Pelvic pain.   EXAM: CT ABDOMEN AND PELVIS WITH CONTRAST   TECHNIQUE: Multidetector CT imaging of the abdomen and pelvis was performed using the standard protocol following bolus administration of intravenous contrast.   RADIATION DOSE REDUCTION: This exam was performed according to the departmental dose-optimization program which includes automated exposure control, adjustment of the mA and/or kV according to patient size and/or use of iterative reconstruction technique.   CONTRAST:  OMNIPAQUE IOHEXOL 300 MG/ML  SOLN   COMPARISON:  10/09/2016   FINDINGS: Lower chest: No acute findings.   Hepatobiliary: No suspicious focal abnormality within the  liver parenchyma. There is no evidence for gallstones, gallbladder wall thickening, or pericholecystic fluid. No intrahepatic or extrahepatic biliary dilation.   Pancreas: No focal mass lesion. No dilatation of the main duct. No intraparenchymal cyst. No peripancreatic edema.   Spleen: No splenomegaly. No suspicious focal mass lesion.   Adrenals/Urinary Tract: No adrenal nodule or mass. Kidneys unremarkable. No evidence for hydroureter. The urinary bladder appears normal for the degree of distention.   Stomach/Bowel: Stomach is unremarkable. No gastric wall thickening. No evidence of outlet obstruction. Duodenum is normally positioned as is the ligament of Treitz. No small bowel wall thickening. No small bowel dilatation. The terminal ileum is normal. The appendix is not well visualized, but there is no edema or inflammation in the region of the cecal tip to suggest appendicitis. No gross colonic mass. No colonic wall thickening.   Vascular/Lymphatic: No abdominal aortic aneurysm. No abdominal aortic atherosclerotic calcification. There is no gastrohepatic or hepatoduodenal ligament lymphadenopathy. No retroperitoneal or mesenteric lymphadenopathy. No pelvic sidewall lymphadenopathy.   Reproductive: The uterus is unremarkable. Neither ovary is discretely visible due to prominent free fluid in the pelvis. There does appear to be a 3.1 x 2.9 cm soft tissue structure adjacent to the left psoas muscle on 58/2 which could be a high left ovary. The left gonadal vessels do appear to extend into the inferior aspect of this finding.   Other: Moderate to large volume free fluid in the abdomen and pelvis. Fluid adjacent to the liver and spleen has attenuation higher than would be expected for simple fluid. The fluid  in the low pelvis (see image 71/2) has relatively high density consistent with blood products/clot or infectious debris. There is probably some layering clot in the left  paracolic gutter (21/3). High density fluid is also seen in the anterior pelvis.   Musculoskeletal: No worrisome lytic or sclerotic osseous abnormality.   IMPRESSION: 1. Moderate to large volume free fluid in the abdomen and pelvis. Fluid adjacent to the liver and spleen has attenuation higher than would be expected for simple fluid. The fluid in the low pelvis has relatively high density consistent with blood products/clot or infectious debris with some probable clot layering in the left paracolic gutter. Generally, by CT imaging, appearance is most suggestive of hemoperitoneum. 2. Neither ovary is discretely visible due to prominent free fluid in the pelvis. There does appear to be prominent soft tissue in the left adnexal space with a 3.1 x 2.9 cm soft tissue structure adjacent to the left psoas muscle which could be a high left ovary. In the absence of pregnancy, the main differential considerations would include ovarian torsion, ruptured hemorrhagic cyst, or tubo-ovarian abscess. Pelvic ultrasound recommended to further evaluate.   Findings were discussed with Dr. Preston Fleeting at approximately 0617 hours on 12/28/2023.     Electronically Signed   By: Kennith Center M.D.   On: 12/28/2023 06:19    Narrative & Impression  CLINICAL DATA:  Pelvic pain. Hemoperitoneum on CT imaging earlier today. Negative pregnancy test.   EXAM: TRANSABDOMINAL ULTRASOUND OF PELVIS   DOPPLER ULTRASOUND OF OVARIES   TECHNIQUE: Transabdominal ultrasound examination of the pelvis was performed including evaluation of the uterus, ovaries, adnexal regions, and pelvic cul-de-sac.   Color and duplex Doppler ultrasound was utilized to evaluate blood flow to the ovaries.   COMPARISON:  CT scan from earlier same day   FINDINGS: Uterus   Measurements: 11.3 x 4.3 x 4.5 cm = volume: 113 mL. No fibroids or other mass visualized.   Endometrium   Thickness: 9 mm.  No focal abnormality visualized.    Right ovary   Measurements: 3.3 x 2.6 x 2.1 cm = volume: 9.3 mL. Normal appearance/no adnexal mass.   Left ovary   Measurements: 3.5 x 3.2 x 3.3 cm = volume: 19.4 mL. Left ovary is encased in echogenic material without demonstrable flow signal on Doppler imaging and likely representing blood clot.   Pulsed Doppler evaluation demonstrates normal low-resistance arterial and venous waveforms in both ovaries.   Other: Large volume complex free fluid identified in the pelvis, as noted above with apparent clot in the left adnexal space.   IMPRESSION: 1. Large volume complex free fluid in the pelvis with apparent clot in the left adnexal space, encasing the left ovary. Imaging features are compatible with hemoperitoneum. No features to suggest left ovarian torsion given demonstrable low resistance arterial and venous blood flow identified in the left ovary on pulsed Doppler evaluation. No adnexal mass lesion evident. No definite tubo-ovarian abscess. Given the negative pregnancy test, ruptured ectopic pregnancy is considered unlikely.     Electronically Signed   By: Kennith Center M.D.   On: 12/28/2023 07:58    Discharge Exam: Blood pressure (!) 106/46, pulse 76, temperature 98 F (36.7 C), temperature source Oral, resp. rate 16, height 5\' 8"  (1.727 m), weight 77.1 kg, last menstrual period 11/13/2023, SpO2 100%. General appearance: alert Resp: clear to auscultation bilaterally Cardio: regular rate and rhythm, S1, S2 normal, no murmur, click, rub or gallop GI: soft, non-tender; bowel sounds normal; no masses,  no organomegaly Extremities: extremities normal, atraumatic, no cyanosis or edema Skin: Skin color, texture, turgor normal. No rashes or lesions Neurologic: Grossly normal  Disposition: Discharge disposition: 01-Home or Self Care       Discharge Instructions     Discharge patient   Complete by: As directed    Discharge disposition: 01-Home or Self Care    Discharge patient date: 12/29/2023      Allergies as of 12/29/2023   No Known Allergies      Medication List     STOP taking these medications    Lo Loestrin Fe 1 MG-10 MCG / 10 MCG tablet Generic drug: Norethindrone-Ethinyl Estradiol-Fe Biphas   medroxyPROGESTERone 10 MG tablet Commonly known as: PROVERA   predniSONE 20 MG tablet Commonly known as: DELTASONE       TAKE these medications    Excedrin Migraine 250-250-65 MG tablet Generic drug: aspirin-acetaminophen-caffeine Take 1 tablet by mouth every 6 (six) hours as needed for headache or migraine.   ibuprofen 400 MG tablet Commonly known as: ADVIL Take 1 tablet (400 mg total) by mouth every 8 (eight) hours as needed.   levocetirizine 5 MG tablet Commonly known as: XYZAL Take 1 tablet (5 mg total) by mouth every evening.   methadone 10 MG/5ML solution Commonly known as: DOLOPHINE Take 80 mg by mouth daily. She is currently titrating Methadone.   norgestimate-ethinyl estradiol 0.25-35 MG-MCG tablet Commonly known as: ORTHO-CYCLEN Take 1 tablet by mouth daily.   oxyCODONE-acetaminophen 5-325 MG tablet Commonly known as: PERCOCET/ROXICET Take 1 tablet by mouth every 6 (six) hours as needed.   polyethylene glycol 17 g packet Commonly known as: MIRALAX / GLYCOLAX Take 17 g by mouth daily.   promethazine-dextromethorphan 6.25-15 MG/5ML syrup Commonly known as: PROMETHAZINE-DM Take 5 mLs by mouth 4 (four) times daily as needed for cough.   simethicone 80 MG chewable tablet Commonly known as: MYLICON Chew 1 tablet (80 mg total) by mouth 4 (four) times daily as needed for flatulence (gas).        Follow-up Information     [facility]. Go on 01/17/2024.   Specialty: Obstetrics and Gynecology Contact information: [address] [place] [address] [phone]                Signed: Mackinac Bing 12/29/2023, 10:24 AM

## 2023-12-30 DIAGNOSIS — D62 Acute posthemorrhagic anemia: Secondary | ICD-10-CM | POA: Diagnosis present

## 2024-01-01 ENCOUNTER — Encounter: Payer: Self-pay | Admitting: Obstetrics and Gynecology

## 2024-01-03 ENCOUNTER — Encounter: Payer: Self-pay | Admitting: Obstetrics & Gynecology

## 2024-01-04 ENCOUNTER — Emergency Department (HOSPITAL_BASED_OUTPATIENT_CLINIC_OR_DEPARTMENT_OTHER)

## 2024-01-04 ENCOUNTER — Other Ambulatory Visit: Payer: Self-pay

## 2024-01-04 ENCOUNTER — Inpatient Hospital Stay (HOSPITAL_BASED_OUTPATIENT_CLINIC_OR_DEPARTMENT_OTHER)
Admission: EM | Admit: 2024-01-04 | Discharge: 2024-01-08 | DRG: 393 | Disposition: A | Discharge status: Home / Self Care | Attending: Family Medicine | Admitting: Family Medicine

## 2024-01-04 ENCOUNTER — Encounter (HOSPITAL_BASED_OUTPATIENT_CLINIC_OR_DEPARTMENT_OTHER): Payer: Self-pay | Admitting: Emergency Medicine

## 2024-01-04 DIAGNOSIS — K659 Peritonitis, unspecified: Principal | ICD-10-CM | POA: Diagnosis present

## 2024-01-04 DIAGNOSIS — Z9152 Personal history of nonsuicidal self-harm: Secondary | ICD-10-CM

## 2024-01-04 DIAGNOSIS — Z813 Family history of other psychoactive substance abuse and dependence: Secondary | ICD-10-CM

## 2024-01-04 DIAGNOSIS — F4310 Post-traumatic stress disorder, unspecified: Secondary | ICD-10-CM | POA: Diagnosis present

## 2024-01-04 DIAGNOSIS — F329 Major depressive disorder, single episode, unspecified: Secondary | ICD-10-CM | POA: Diagnosis present

## 2024-01-04 DIAGNOSIS — F411 Generalized anxiety disorder: Secondary | ICD-10-CM | POA: Diagnosis present

## 2024-01-04 DIAGNOSIS — Z8249 Family history of ischemic heart disease and other diseases of the circulatory system: Secondary | ICD-10-CM

## 2024-01-04 DIAGNOSIS — Z1152 Encounter for screening for COVID-19: Secondary | ICD-10-CM

## 2024-01-04 DIAGNOSIS — K661 Hemoperitoneum: Principal | ICD-10-CM | POA: Diagnosis present

## 2024-01-04 DIAGNOSIS — Z79899 Other long term (current) drug therapy: Secondary | ICD-10-CM

## 2024-01-04 DIAGNOSIS — N739 Female pelvic inflammatory disease, unspecified: Secondary | ICD-10-CM | POA: Diagnosis present

## 2024-01-04 DIAGNOSIS — F1290 Cannabis use, unspecified, uncomplicated: Secondary | ICD-10-CM | POA: Diagnosis present

## 2024-01-04 DIAGNOSIS — F909 Attention-deficit hyperactivity disorder, unspecified type: Secondary | ICD-10-CM | POA: Diagnosis present

## 2024-01-04 DIAGNOSIS — Z818 Family history of other mental and behavioral disorders: Secondary | ICD-10-CM

## 2024-01-04 DIAGNOSIS — R7881 Bacteremia: Secondary | ICD-10-CM | POA: Diagnosis present

## 2024-01-04 DIAGNOSIS — N735 Female pelvic peritonitis, unspecified: Principal | ICD-10-CM | POA: Diagnosis present

## 2024-01-04 DIAGNOSIS — F17210 Nicotine dependence, cigarettes, uncomplicated: Secondary | ICD-10-CM | POA: Diagnosis present

## 2024-01-04 DIAGNOSIS — Z823 Family history of stroke: Secondary | ICD-10-CM

## 2024-01-04 LAB — URINALYSIS, W/ REFLEX TO CULTURE (INFECTION SUSPECTED)
Bacteria, UA: NONE SEEN
Bilirubin Urine: NEGATIVE
Glucose, UA: NEGATIVE mg/dL
Ketones, ur: NEGATIVE mg/dL
Leukocytes,Ua: NEGATIVE
Nitrite: NEGATIVE
Protein, ur: NEGATIVE mg/dL
Specific Gravity, Urine: 1.023 (ref 1.005–1.030)
pH: 6.5 (ref 5.0–8.0)

## 2024-01-04 LAB — LACTIC ACID, PLASMA: Lactic Acid, Venous: 0.5 mmol/L (ref 0.5–1.9)

## 2024-01-04 LAB — COMPREHENSIVE METABOLIC PANEL WITH GFR
ALT: 12 U/L (ref 0–44)
AST: 27 U/L (ref 15–41)
Albumin: 4.2 g/dL (ref 3.5–5.0)
Alkaline Phosphatase: 43 U/L (ref 38–126)
Anion gap: 8 (ref 5–15)
BUN: 15 mg/dL (ref 6–20)
CO2: 25 mmol/L (ref 22–32)
Calcium: 8.5 mg/dL — ABNORMAL LOW (ref 8.9–10.3)
Chloride: 104 mmol/L (ref 98–111)
Creatinine, Ser: 0.76 mg/dL (ref 0.44–1.00)
GFR, Estimated: >60 mL/min (ref >60)
Glucose, Bld: 83 mg/dL (ref 70–99)
Potassium: 4 mmol/L (ref 3.5–5.1)
Sodium: 137 mmol/L (ref 135–145)
Total Bilirubin: 0.9 mg/dL (ref 0.0–1.2)
Total Protein: 6.9 g/dL (ref 6.5–8.1)

## 2024-01-04 LAB — CBC WITH DIFFERENTIAL/PLATELET
Abs Immature Granulocytes: 0.01 10*3/uL (ref 0.00–0.07)
Basophils Absolute: 0 10*3/uL (ref 0.0–0.1)
Basophils Relative: 1 %
Eosinophils Absolute: 0 10*3/uL (ref 0.0–0.5)
Eosinophils Relative: 1 %
HCT: 29.6 % — ABNORMAL LOW (ref 36.0–46.0)
Hemoglobin: 10.2 g/dL — ABNORMAL LOW (ref 12.0–15.0)
Immature Granulocytes: 0 %
Lymphocytes Relative: 32 %
Lymphs Abs: 1.2 10*3/uL (ref 0.7–4.0)
MCH: 32.5 pg (ref 26.0–34.0)
MCHC: 34.5 g/dL (ref 30.0–36.0)
MCV: 94.3 fL (ref 80.0–100.0)
Monocytes Absolute: 0.5 10*3/uL (ref 0.1–1.0)
Monocytes Relative: 12 %
Neutro Abs: 2.1 10*3/uL (ref 1.7–7.7)
Neutrophils Relative %: 54 %
Platelets: 182 10*3/uL (ref 150–400)
RBC: 3.14 MIL/uL — ABNORMAL LOW (ref 3.87–5.11)
RDW: 12.5 % (ref 11.5–15.5)
WBC: 3.8 10*3/uL — ABNORMAL LOW (ref 4.0–10.5)
nRBC: 0 % (ref 0.0–0.2)

## 2024-01-04 LAB — RESP PANEL BY RT-PCR (RSV, FLU A&B, COVID)  RVPGX2
Influenza A by PCR: NEGATIVE
Influenza B by PCR: NEGATIVE
Resp Syncytial Virus by PCR: NEGATIVE
SARS Coronavirus 2 by RT PCR: NEGATIVE

## 2024-01-04 LAB — PREGNANCY, URINE: Preg Test, Ur: NEGATIVE

## 2024-01-04 LAB — LIPASE, BLOOD: Lipase: 28 U/L (ref 11–51)

## 2024-01-04 MED ORDER — IOHEXOL 300 MG/ML  SOLN
100.0000 mL | Freq: Once | INTRAMUSCULAR | Status: AC | PRN
  Administered 2024-01-04: 85 mL via INTRAVENOUS

## 2024-01-04 MED ORDER — SODIUM CHLORIDE 0.9 % IV BOLUS
1000.0000 mL | Freq: Once | INTRAVENOUS | Status: AC
  Administered 2024-01-04: 1000 mL via INTRAVENOUS

## 2024-01-04 MED ORDER — ONDANSETRON HCL 4 MG/2ML IJ SOLN
4.0000 mg | Freq: Once | INTRAMUSCULAR | Status: AC
Start: 2024-01-04 — End: 2024-01-04
  Administered 2024-01-04: 4 mg via INTRAVENOUS
  Filled 2024-01-04: qty 2

## 2024-01-04 MED ORDER — ACETAMINOPHEN 500 MG PO TABS
1000.0000 mg | ORAL_TABLET | Freq: Once | ORAL | Status: AC
  Administered 2024-01-04: 1000 mg via ORAL
  Filled 2024-01-04: qty 2

## 2024-01-04 MED ORDER — PIPERACILLIN-TAZOBACTAM 3.375 G IVPB
3.3750 g | Freq: Three times a day (TID) | INTRAVENOUS | Status: DC
  Administered 2024-01-04 – 2024-01-08 (×11): 3.375 g via INTRAVENOUS
  Filled 2024-01-04 (×12): qty 50

## 2024-01-04 MED ORDER — HYDROMORPHONE HCL 1 MG/ML IJ SOLN
0.5000 mg | Freq: Once | INTRAMUSCULAR | Status: AC
  Administered 2024-01-04: 0.5 mg via INTRAVENOUS
  Filled 2024-01-04: qty 1

## 2024-01-04 NOTE — ED Notes (Signed)
 ED Provider at bedside.

## 2024-01-04 NOTE — Progress Notes (Signed)
 Pharmacy Antibiotic Note  Tracy Stout is a 28 y.o. female admitted on 01/04/2024 with an intra-abdominal infection. Patient previously admitted for ruptured ovarian cyst on 4/3. Patient febrile in the ED, temperature 101.F. Pharmacy has been consulted for Zosyn dosing.  Plan: Zosyn 3.375 gm IV every 8 hours Monitor cultures and clinical signs of improvement F/u LOT and de-escalate antibiotics as appropriate  Height: 5\' 8"  (172.7 cm) Weight: 73.5 kg (162 lb) IBW/kg (Calculated) : 63.9  Temp (24hrs), Avg:100.4 F (38 C), Min:98.9 F (37.2 C), Max:101.9 F (38.8 C)  No results for input(s): "WBC", "CREATININE", "LATICACIDVEN", "VANCOTROUGH", "VANCOPEAK", "VANCORANDOM", "GENTTROUGH", "GENTPEAK", "GENTRANDOM", "TOBRATROUGH", "TOBRAPEAK", "TOBRARND", "AMIKACINPEAK", "AMIKACINTROU", "AMIKACIN" in the last 168 hours.  Estimated Creatinine Clearance: 108.4 mL/min (by C-G formula based on SCr of 0.8 mg/dL).    No Known Allergies  Antimicrobials this admission: Zosyn 4/10 >>  Microbiology results: 4/10 Bcx:  Thank you for allowing pharmacy to be a part of this patient's care.  Enos Fling, PharmD PGY-1 Acute Care Pharmacy Resident 01/04/2024 9:56 PM

## 2024-01-04 NOTE — ED Provider Notes (Signed)
 [name] AT [name] Provider Note   CSN: [id number] Arrival date & time: 01/04/24  2003     History  Chief Complaint  Patient presents with   Abdominal Pain    Tracy Stout is a 28 y.o. female who presents to the emergency department for evaluation of fever and abdominal pain.  Patient was discharged from the hospital 6 days ago after presenting with abdominal pain found to have a ruptured ovarian cyst with diffuse hemoperitoneum slight anemia negative pregnancy test.  She was stabilized on p.o. medications and discharged with oral pain medication.  Patient reports that her pain has been severe and persistent since discharge she has no significant changes in that however midweek she began running fever between 100.7 and 102 F.  She was seen in the outpatient setting and had a workup including a respiratory panel that was negative for acute finding.  She denies urinary symptoms cough vomiting or diarrhea   Abdominal Pain      Home Medications Prior to Admission medications   Medication Sig Start Date End Date Taking? Authorizing Provider  norgestimate-ethinyl estradiol (ORTHO-CYCLEN) 0.25-35 MG-MCG tablet Take 1 tablet by mouth daily. 12/29/23  Yes Raynell Caller, MD  oxyCODONE-acetaminophen (PERCOCET/ROXICET) 5-325 MG tablet Take 1 tablet by mouth every 6 (six) hours as needed. Patient taking differently: Take 1 tablet by mouth every 6 (six) hours as needed for moderate pain (pain score 4-6). 12/29/23  Yes Raynell Caller, MD  polyethylene glycol powder (GLYCOLAX/MIRALAX) 17 GM/SCOOP powder Take 17 g by mouth daily. Patient not taking: Reported on 01/05/2024 12/29/23   Raynell Caller, MD  simethicone (MYLICON) 80 MG chewable tablet Chew 1 tablet (80 mg total) by mouth 4 (four) times daily as needed for flatulence (gas). Patient not taking: Reported on 01/05/2024 12/29/23   Raynell Caller, MD      Allergies    Patient has no known allergies.     Review of Systems   Review of Systems  Gastrointestinal:  Positive for abdominal pain.    Physical Exam Updated Vital Signs BP 97/62 (BP Location: Right Arm)   Pulse 74   Temp 98.3 F (36.8 C) (Oral)   Resp 18   Ht 5\' 8"  (1.727 m)   Wt 73.5 kg   LMP 11/13/2023 (Exact Date)   SpO2 98%   BMI 24.63 kg/m  Physical Exam Vitals and nursing note reviewed.  Constitutional:      General: She is not in acute distress.    Appearance: She is well-developed. She is not diaphoretic.  HENT:     Head: Normocephalic and atraumatic.     Right Ear: External ear normal.     Left Ear: External ear normal.     Nose: Nose normal.     Mouth/Throat:     Mouth: Mucous membranes are moist.  Eyes:     General: No scleral icterus.    Conjunctiva/sclera: Conjunctivae normal.  Cardiovascular:     Rate and Rhythm: Normal rate and regular rhythm.     Heart sounds: Normal heart sounds. No murmur heard.    No friction rub. No gallop.  Pulmonary:     Effort: Pulmonary effort is normal. No respiratory distress.     Breath sounds: Normal breath sounds.  Abdominal:     General: Bowel sounds are normal. There is no distension.     Palpations: Abdomen is soft. There is no mass.     Tenderness: There is generalized abdominal tenderness. There is guarding.  Comments: Diffuse abdominal tenderness, involuntary guarding and rigidity noted  Musculoskeletal:     Cervical back: Normal range of motion.  Skin:    General: Skin is warm and dry.  Neurological:     Mental Status: She is alert and oriented to person, place, and time.  Psychiatric:        Behavior: Behavior normal.     ED Results / Procedures / Treatments   Labs (all labs ordered are listed, but only abnormal results are displayed) Labs Reviewed  URINE CULTURE - Abnormal; Notable for the following components:      Result Value   Culture MULTIPLE SPECIES PRESENT, SUGGEST RECOLLECTION (*)    All other components within normal limits   COMPREHENSIVE METABOLIC PANEL WITH GFR - Abnormal; Notable for the following components:   Calcium 8.5 (*)    All other components within normal limits  CBC WITH DIFFERENTIAL/PLATELET - Abnormal; Notable for the following components:   WBC 3.8 (*)    RBC 3.14 (*)    Hemoglobin 10.2 (*)    HCT 29.6 (*)    All other components within normal limits  URINALYSIS, W/ REFLEX TO CULTURE (INFECTION SUSPECTED) - Abnormal; Notable for the following components:   Hgb urine dipstick SMALL (*)    All other components within normal limits  BASIC METABOLIC PANEL WITH GFR - Abnormal; Notable for the following components:   Calcium 8.1 (*)    All other components within normal limits  CBC WITH DIFFERENTIAL/PLATELET - Abnormal; Notable for the following components:   WBC 2.4 (*)    RBC 2.81 (*)    Hemoglobin 9.2 (*)    HCT 27.0 (*)    Platelets 144 (*)    Neutro Abs 0.9 (*)    All other components within normal limits  LACTIC ACID, PLASMA - Abnormal; Notable for the following components:   Lactic Acid, Venous 2.2 (*)    All other components within normal limits  C-REACTIVE PROTEIN - Abnormal; Notable for the following components:   CRP 1.5 (*)    All other components within normal limits  BASIC METABOLIC PANEL WITH GFR - Abnormal; Notable for the following components:   Glucose, Bld 148 (*)    Calcium 8.2 (*)    All other components within normal limits  CBC WITH DIFFERENTIAL/PLATELET - Abnormal; Notable for the following components:   WBC 3.5 (*)    RBC 2.82 (*)    Hemoglobin 9.1 (*)    HCT 27.1 (*)    Platelets 147 (*)    All other components within normal limits  CBC - Abnormal; Notable for the following components:   WBC 3.6 (*)    RBC 2.94 (*)    Hemoglobin 9.6 (*)    HCT 28.4 (*)    All other components within normal limits  CBC - Abnormal; Notable for the following components:   WBC 3.7 (*)    RBC 2.76 (*)    Hemoglobin 8.8 (*)    HCT 26.4 (*)    All other components within  normal limits  CULTURE, BLOOD (ROUTINE X 2)  RESP PANEL BY RT-PCR (RSV, FLU A&B, COVID)  [id number]  LACTIC ACID, PLASMA  PREGNANCY, URINE  LIPASE, BLOOD  LACTIC ACID, PLASMA  PROCALCITONIN  LACTIC ACID, PLASMA  LACTIC ACID, PLASMA  LACTIC ACID, PLASMA  PATHOLOGIST SMEAR REVIEW    EKG None  Radiology No results found.  Procedures Procedures    Medications Ordered in ED Medications  piperacillin-tazobactam (ZOSYN) IVPB 3.375  g (3.375 g Intravenous New Bag/Given 01/07/24 1523)  sodium chloride flush (NS) 0.9 % injection 3 mL (3 mLs Intravenous Given 01/07/24 0911)  HYDROmorphone (DILAUDID) injection 0.5 mg (0.5 mg Intravenous Given 01/07/24 1518)  oxyCODONE-acetaminophen (PERCOCET/ROXICET) 5-325 MG per tablet 1-2 tablet (2 tablets Oral Given 01/07/24 1221)  pantoprazole (PROTONIX) EC tablet 40 mg (40 mg Oral Given 01/07/24 0908)  ibuprofen (ADVIL) tablet 600 mg (has no administration in time range)  doxycycline (VIBRAMYCIN) 100 mg in sodium chloride 0.9 % 250 mL IVPB (100 mg Intravenous New Bag/Given 01/07/24 1231)  sodium chloride 0.9 % bolus 1,000 mL (0 mLs Intravenous Stopped 01/04/24 2244)  HYDROmorphone (DILAUDID) injection 0.5 mg (0.5 mg Intravenous Given 01/04/24 2150)  ondansetron (ZOFRAN) injection 4 mg (4 mg Intravenous Given 01/04/24 2147)  acetaminophen (TYLENOL) tablet 1,000 mg (1,000 mg Oral Given 01/04/24 2213)  iohexol (OMNIPAQUE) 300 MG/ML solution 100 mL (85 mLs Intravenous Contrast Given 01/04/24 2244)  HYDROmorphone (DILAUDID) injection 0.5 mg (0.5 mg Intravenous Given 01/05/24 0217)  HYDROmorphone (DILAUDID) injection 0.5 mg (0.5 mg Intravenous Given 01/05/24 0553)  dextrose 5 % and 0.9 % NaCl 1,000 mL with potassium chloride 20 mEq infusion ( Intravenous New Bag/Given 01/06/24 0143)  sodium chloride 0.9 % bolus 500 mL (500 mLs Intravenous New Bag/Given 01/06/24 2123)    ED Course/ Medical Decision Making/ A&P Clinical Course as of 01/07/24 1537  Thu Jan 04, 2024  2144  Temp(!): 101.9 F (38.8 C) [AH]  2257 WBC(!): 3.8 [AH]  2257 Hemoglobin(!): 10.2 [AH]  2323 Case discussed with Dr. Thurmon [place]- ask that we wait for CT reading and cover with abx.  [AH]    Clinical Course User Index [AH] Tama Fails, PA-C                                 Medical Decision Making Amount and/or Complexity of Data Reviewed Labs: ordered. Decision-making details documented in ED Course. Radiology: ordered.  Risk OTC drugs. Prescription drug management. Decision regarding hospitalization.   This patient presents to the ED for concern of fever and abdominal pain, this involves an extensive number of treatment options, and is a complaint that carries with it a high risk of complications and morbidity.  The differential diagnosis for generalized abdominal pain includes, but is not limited to AAA, gastroenteritis, appendicitis, Bowel obstruction, Bowel perforation. Gastroparesis, DKA, Hernia, Inflammatory bowel disease, mesenteric ischemia, pancreatitis, bacterail peritonitis, volvulus.   Co morbidities: Recent hemoperitoneum  Social Determinants of Health:   SDOH Screenings   Food Insecurity: No Food Insecurity (01/05/2024)  Housing: High Risk (01/05/2024)  Transportation Needs: No Transportation Needs (01/05/2024)  Utilities: Not At Risk (01/05/2024)  Depression (PHQ2-9): Medium Risk (01/08/2019)  Social Connections: Unknown (01/05/2024)  Tobacco Use: High Risk (01/04/2024)     Additional history:  {Additional history obtained from mother Previous records reviewed including recent admission documents WBC low 3.8 HGB low 10.2>8.8 LA 0.5 Resp pnl negative  Lab Tests:  I Ordered, and personally interpreted labs.  The pertinent results include:    Sepsis orderset utilized: UA- ? Infection v contamination- sent for culture   Imaging Studies:  I ordered imaging studies including cxr/ ct abd I independently visualized and interpreted imaging which showed  cxr/ ? Bronchitis; ct- large but improving hemoperitoneum- no focal abscess I agree with the radiologist interpretation  Cardiac Monitoring/ECG:  The patient was maintained on a cardiac monitor.  I personally viewed and interpreted the cardiac  monitored which showed an underlying rhythm of: sinus rhythm  Medicines ordered and prescription drug management:  I ordered medication including  Medications  piperacillin-tazobactam (ZOSYN) IVPB 3.375 g (3.375 g Intravenous New Bag/Given 01/07/24 1523)  sodium chloride flush (NS) 0.9 % injection 3 mL (3 mLs Intravenous Given 01/07/24 0911)  HYDROmorphone (DILAUDID) injection 0.5 mg (0.5 mg Intravenous Given 01/07/24 1518)      ibuprofen (ADVIL) tablet 600 mg (has no administration in time range)                       for preusmed bacterial peritonitis Reevaluation of the patient after these medicines showed that the patient improved I have reviewed the patients home medicines and have made adjustments as needed  Test Considered:    Critical Interventions:  fluids, abx, pain control  Consultations Obtained: Dr. Thurmon [place] who will consult asks for medicine admission  Problem List / ED Course:     ICD-10-CM   1. Bacterial peritonitis (HCC)  K65.9       MDM:  Fever, abd pain recent hemoperitoneum- suspect BP vs uti- urine culture pending   Dispostion:  After consideration of the diagnostic results and the patients response to treatment, I feel that the patent would benefit from admission.         Final Clinical Impression(s) / ED Diagnoses Final diagnoses:  Bacterial peritonitis [facility])    Rx / DC Orders ED Discharge Orders     None         Tama Fails, PA-C 01/07/24 1538    Owen Blowers P, DO 01/10/24 1550

## 2024-01-04 NOTE — ED Triage Notes (Signed)
 Pt POV reports persistent pelvic pain and fever x3 days, admitted 4/3 for ruptured ovarian cyst, discharged with percocet for pain, has been taking but now pain has increased. Tearful in triage.

## 2024-01-05 ENCOUNTER — Encounter (HOSPITAL_BASED_OUTPATIENT_CLINIC_OR_DEPARTMENT_OTHER): Payer: Self-pay | Admitting: Internal Medicine

## 2024-01-05 DIAGNOSIS — F4310 Post-traumatic stress disorder, unspecified: Secondary | ICD-10-CM | POA: Diagnosis not present

## 2024-01-05 DIAGNOSIS — F1290 Cannabis use, unspecified, uncomplicated: Secondary | ICD-10-CM | POA: Diagnosis not present

## 2024-01-05 DIAGNOSIS — R7881 Bacteremia: Secondary | ICD-10-CM | POA: Diagnosis not present

## 2024-01-05 DIAGNOSIS — Z8249 Family history of ischemic heart disease and other diseases of the circulatory system: Secondary | ICD-10-CM | POA: Diagnosis not present

## 2024-01-05 DIAGNOSIS — Z79899 Other long term (current) drug therapy: Secondary | ICD-10-CM | POA: Diagnosis not present

## 2024-01-05 DIAGNOSIS — Z813 Family history of other psychoactive substance abuse and dependence: Secondary | ICD-10-CM | POA: Diagnosis not present

## 2024-01-05 DIAGNOSIS — N735 Female pelvic peritonitis, unspecified: Principal | ICD-10-CM

## 2024-01-05 DIAGNOSIS — F909 Attention-deficit hyperactivity disorder, unspecified type: Secondary | ICD-10-CM | POA: Diagnosis not present

## 2024-01-05 DIAGNOSIS — Z823 Family history of stroke: Secondary | ICD-10-CM | POA: Diagnosis not present

## 2024-01-05 DIAGNOSIS — Z1152 Encounter for screening for COVID-19: Secondary | ICD-10-CM | POA: Diagnosis not present

## 2024-01-05 DIAGNOSIS — F329 Major depressive disorder, single episode, unspecified: Secondary | ICD-10-CM | POA: Diagnosis not present

## 2024-01-05 DIAGNOSIS — Z9152 Personal history of nonsuicidal self-harm: Secondary | ICD-10-CM | POA: Diagnosis not present

## 2024-01-05 DIAGNOSIS — K659 Peritonitis, unspecified: Secondary | ICD-10-CM | POA: Diagnosis present

## 2024-01-05 DIAGNOSIS — Z818 Family history of other mental and behavioral disorders: Secondary | ICD-10-CM | POA: Diagnosis not present

## 2024-01-05 DIAGNOSIS — F411 Generalized anxiety disorder: Secondary | ICD-10-CM | POA: Diagnosis not present

## 2024-01-05 DIAGNOSIS — K661 Hemoperitoneum: Secondary | ICD-10-CM | POA: Diagnosis not present

## 2024-01-05 DIAGNOSIS — F17210 Nicotine dependence, cigarettes, uncomplicated: Secondary | ICD-10-CM | POA: Diagnosis not present

## 2024-01-05 DIAGNOSIS — N739 Female pelvic inflammatory disease, unspecified: Secondary | ICD-10-CM | POA: Diagnosis not present

## 2024-01-05 LAB — CBC WITH DIFFERENTIAL/PLATELET
Abs Immature Granulocytes: 0 10*3/uL (ref 0.00–0.07)
Basophils Absolute: 0 10*3/uL (ref 0.0–0.1)
Basophils Relative: 1 %
Eosinophils Absolute: 0.1 10*3/uL (ref 0.0–0.5)
Eosinophils Relative: 4 %
HCT: 27 % — ABNORMAL LOW (ref 36.0–46.0)
Hemoglobin: 9.2 g/dL — ABNORMAL LOW (ref 12.0–15.0)
Immature Granulocytes: 0 %
Lymphocytes Relative: 45 %
Lymphs Abs: 1.1 10*3/uL (ref 0.7–4.0)
MCH: 32.7 pg (ref 26.0–34.0)
MCHC: 34.1 g/dL (ref 30.0–36.0)
MCV: 96.1 fL (ref 80.0–100.0)
Monocytes Absolute: 0.3 10*3/uL (ref 0.1–1.0)
Monocytes Relative: 14 %
Neutro Abs: 0.9 10*3/uL — ABNORMAL LOW (ref 1.7–7.7)
Neutrophils Relative %: 36 %
Platelets: 144 10*3/uL — ABNORMAL LOW (ref 150–400)
RBC: 2.81 MIL/uL — ABNORMAL LOW (ref 3.87–5.11)
RDW: 12.5 % (ref 11.5–15.5)
WBC: 2.4 10*3/uL — ABNORMAL LOW (ref 4.0–10.5)
nRBC: 0 % (ref 0.0–0.2)

## 2024-01-05 LAB — LACTIC ACID, PLASMA
Lactic Acid, Venous: 0.6 mmol/L (ref 0.5–1.9)
Lactic Acid, Venous: 2.2 mmol/L (ref 0.5–1.9)
Lactic Acid, Venous: 1 mmol/L (ref 0.5–1.9)

## 2024-01-05 LAB — BASIC METABOLIC PANEL WITH GFR
Anion gap: 7 (ref 5–15)
BUN: 10 mg/dL (ref 6–20)
CO2: 26 mmol/L (ref 22–32)
Calcium: 8.1 mg/dL — ABNORMAL LOW (ref 8.9–10.3)
Chloride: 102 mmol/L (ref 98–111)
Creatinine, Ser: 0.77 mg/dL (ref 0.44–1.00)
GFR, Estimated: >60 mL/min (ref >60)
Glucose, Bld: 97 mg/dL (ref 70–99)
Potassium: 3.9 mmol/L (ref 3.5–5.1)
Sodium: 135 mmol/L (ref 135–145)

## 2024-01-05 LAB — C-REACTIVE PROTEIN: CRP: 1.5 mg/dL — ABNORMAL HIGH (ref <1.0)

## 2024-01-05 LAB — PROCALCITONIN: Procalcitonin: <0.1 ng/mL

## 2024-01-05 MED ORDER — HYDROMORPHONE HCL 1 MG/ML IJ SOLN
0.5000 mg | Freq: Once | INTRAMUSCULAR | Status: AC
  Administered 2024-01-05: 0.5 mg via INTRAVENOUS
  Filled 2024-01-05: qty 1

## 2024-01-05 MED ORDER — SODIUM CHLORIDE 0.9% FLUSH
3.0000 mL | Freq: Two times a day (BID) | INTRAVENOUS | Status: DC
  Administered 2024-01-05 – 2024-01-08 (×7): 3 mL via INTRAVENOUS

## 2024-01-05 MED ORDER — PANTOPRAZOLE SODIUM 40 MG PO TBEC
40.0000 mg | DELAYED_RELEASE_TABLET | Freq: Every day | ORAL | Status: DC
  Administered 2024-01-05 – 2024-01-08 (×4): 40 mg via ORAL
  Filled 2024-01-05 (×4): qty 1

## 2024-01-05 MED ORDER — HYDROMORPHONE HCL 1 MG/ML IJ SOLN
0.5000 mg | INTRAMUSCULAR | Status: DC | PRN
  Administered 2024-01-05: 0.5 mg via INTRAVENOUS
  Filled 2024-01-05: qty 0.5

## 2024-01-05 MED ORDER — OXYCODONE-ACETAMINOPHEN 5-325 MG PO TABS
1.0000 | ORAL_TABLET | ORAL | Status: DC | PRN
  Administered 2024-01-05: 2 via ORAL
  Administered 2024-01-05: 1 via ORAL
  Administered 2024-01-05 – 2024-01-08 (×9): 2 via ORAL
  Filled 2024-01-05 (×11): qty 2
  Filled 2024-01-05: qty 1

## 2024-01-05 MED ORDER — KETOROLAC TROMETHAMINE 30 MG/ML IJ SOLN
30.0000 mg | Freq: Four times a day (QID) | INTRAMUSCULAR | Status: DC
  Administered 2024-01-05: 30 mg via INTRAVENOUS
  Filled 2024-01-05: qty 1

## 2024-01-05 MED ORDER — HYDROMORPHONE HCL 1 MG/ML IJ SOLN
1.0000 mg | INTRAMUSCULAR | Status: DC | PRN
  Administered 2024-01-05: 1 mg via INTRAVENOUS
  Filled 2024-01-05: qty 1

## 2024-01-05 MED ORDER — NORGESTIMATE-ETH ESTRADIOL 0.25-35 MG-MCG PO TABS: 1.0000 | ORAL_TABLET | Freq: Every day | ORAL | Status: DC

## 2024-01-05 MED ORDER — KCL IN DEXTROSE-NACL 20-5-0.9 MEQ/L-%-% IV SOLN
INTRAVENOUS | Status: DC
Start: 2024-01-05 — End: 2024-01-06
  Filled 2024-01-05 (×2): qty 1000

## 2024-01-05 MED ORDER — HYDROMORPHONE HCL 1 MG/ML IJ SOLN
0.5000 mg | INTRAMUSCULAR | Status: DC | PRN
  Administered 2024-01-05 – 2024-01-08 (×20): 0.5 mg via INTRAVENOUS
  Filled 2024-01-05 (×20): qty 0.5

## 2024-01-05 NOTE — H&P (Addendum)
 History and Physical    Patient: Tracy Stout ZOX:[id number] DOB: 1996/06/17 DOA: 01/04/2024 DOS: the patient was seen and examined on 01/05/2024 PCP: Patient, No Pcp Per  Patient coming from: Home  Chief Complaint:  Chief Complaint  Patient presents with   Abdominal Pain   HPI: Tracy Stout is a 28 y.o. female with medical history significant of major depressive disorder, generalized anxiety disorder, regular THC use, ADHD, chronic hepatitis C, deliberate self cutting who presented to the ED with profound abdominal pain.  She was recently discharged by the OB/GYN service on 4/4 after being treated for hemorrhagic ovarian cyst with hemoperitoneum.  Triage she reported persistent pelvic pain and fever for 3 days despite use of Percocet for pain.  In triage she was initially afebrile but subsequently while in treatment area she had a Tmax of 101.9.  She was otherwise normotensive with SBP in the 90s.  Labs were unremarkable.  White count was 3800 with normal differential, hemoglobin 10.2.  Due to fever PCR for COVID, influenza and RSV were checked and were negative.  Urinalysis was unremarkable.  Urine pregnancy test was negative.  Blood cultures have been obtained as well as a urine culture.  Abdomen and pelvis was completed that revealed residual moderate to large hemoperitoneum in the pelvis and left greater than right adnexa.  No rim-enhancing fluid collections to suggest pelvic abscesses.  There were gas bubbles in the bladder that correlated with recent instrumentation.  There was also mild bladder wall thickening nonspecific but could be related to acute cystitis.  There was a decrease in hyperdense fluid surrounding the liver.  The pelvic hemoperitoneum had actually decreased from prior imaging. Dr. Macon Large OB/GYN on-call reviewed patient findings and felt no surgical procedure was indicated and recommended hospitalist admission.  They will see patient in consultation and initiation of Zosyn.   General surgery was also consulted earlier and determined there was no role for general surgery.   Upon my evaluation of the patient she reported she was hungry but was having significant suprapubic discomfort.  With my exam she was having guarding and rebounding.  Abdomen was otherwise soft with hypoactive bowel sounds.  She had been receiving Dilaudid 0.5 mg every 2 hours without any significant improvement in pain.   Review of Systems: As mentioned in the history of present illness. All other systems reviewed and are negative. Past Medical History:  Diagnosis Date   ADHD (attention deficit hyperactivity disorder)    Anxiety    Bipolar 1 disorder (HCC)    Borderline personality disorder (HCC)    Depression    History of pyelonephritis during pregnancy 10/06/2016   Admitted 10/06/16> progressed to pneumonia/ARDS> tx to [facility], on ventilator for few days, d/c'd 10/15/16       On Keflex suppression     Pneumonia    Polysubstance abuse (HCC)    PTSD (post-traumatic stress disorder)    Self-inflicted injury    Suicide attempt [facility])    Past Surgical History:  Procedure Laterality Date   tubes in ears     Social History:  reports that she has been smoking cigarettes. She has a 3 pack-year smoking history. She has never used smokeless tobacco. She reports that she does not currently use alcohol. She reports current drug use. Drug: Marijuana.  No Known Allergies  Family History  Problem Relation Age of Onset   Hypertension Paternal Grandmother    Bipolar disorder Paternal Grandmother    Depression Maternal Grandmother    Stroke Maternal  Grandfather        X 5   Bipolar disorder Father    ADD / ADHD Father    Hypertension Father    Drug abuse Father        drug overdose   Depression Father    Depression Mother    ADD / ADHD Brother    Bipolar disorder Sister    Depression Sister    ADD / ADHD Sister    Other Sister        heart condition; had open heart surgery    Prior to  Admission medications   Medication Sig Start Date End Date Taking? Authorizing Provider  aspirin-acetaminophen-caffeine (EXCEDRIN MIGRAINE) [phone] MG tablet Take 1 tablet by mouth every 6 (six) hours as needed for headache or migraine. 12/15/21   Wallis Bamberg, PA-C  ibuprofen (ADVIL) 400 MG tablet Take 1 tablet (400 mg total) by mouth every 8 (eight) hours as needed. 12/29/23   [place] Bing, MD  levocetirizine (XYZAL) 5 MG tablet Take 1 tablet (5 mg total) by mouth every evening. 12/15/21   Wallis Bamberg, PA-C  methadone (DOLOPHINE) 10 MG/5ML solution Take 80 mg by mouth daily. She is currently titrating Methadone.    [provider]  norgestimate-ethinyl estradiol (ORTHO-CYCLEN) 0.25-35 MG-MCG tablet Take 1 tablet by mouth daily. 12/29/23   [place] Bing, MD  oxyCODONE-acetaminophen (PERCOCET/ROXICET) 5-325 MG tablet Take 1 tablet by mouth every 6 (six) hours as needed. 12/29/23   [place] Bing, MD  polyethylene glycol powder (GLYCOLAX/MIRALAX) 17 GM/SCOOP powder Take 17 g by mouth daily. 12/29/23   [place] Bing, MD  promethazine-dextromethorphan (PROMETHAZINE-DM) 6.25-15 MG/5ML syrup Take 5 mLs by mouth 4 (four) times daily as needed for cough. 12/15/21   Wallis Bamberg, PA-C  simethicone (MYLICON) 80 MG chewable tablet Chew 1 tablet (80 mg total) by mouth 4 (four) times daily as needed for flatulence (gas). 12/29/23   [place] Bing, MD    Physical Exam: Vitals:   01/05/24 0415 01/05/24 0515 01/05/24 0530 01/05/24 0802  BP: (!) 90/50 (!) 91/57 98/61 (!) 96/48  Pulse: (!) 59 62 63 63  Resp:  10 14 18   Temp:    98 F (36.7 C)  TempSrc:    Axillary  SpO2: 97% 98% 100% 100%  Weight:      Height:       Constitutional: NAD, calm, comfortable Respiratory: clear to auscultation bilaterally, no wheezing, no crackles. Normal respiratory effort. No accessory muscle use.  Cardiovascular: Regular rate and rhythm, no murmurs / rubs / gallops. No extremity edema. 2+ pedal pulses.  Abdomen:  Focal tenderness entire lower abdomen primarily over suprapubic region although demonstrated guarding with minimal palpation over both lower quadrants as well as rebounding.  Abdomen otherwise soft and nondistended, no masses palpated. No hepatosplenomegaly. Bowel sounds positive but hypoactive.  Musculoskeletal: no clubbing / cyanosis. No joint deformity upper and lower extremities. Good ROM, no contractures. Normal muscle tone.  Skin: no rashes, lesions, ulcers. No induration Neurologic: CN 2-12 grossly intact. Sensation intact, Strength 5/5 x all 4 extremities.  Psychiatric: Normal judgment and insight. Alert and oriented x 3. Normal mood.    Data Reviewed:  Sodium 137, potassium 4.0, glucose 83, BUN 15, creatinine 0.76, anion gap 8, LFTs are normal  Initial lactic acid 0.5  WBC 3800 with normal differential, hemoglobin 10.2, platelets 182,000  Other labs as per HPI  Assessment and Plan: Intractable abdominal pain secondary to hemorrhagic cyst with hemoperitoneum Continue IV Dilaudid 0.5 mg every 2 hours  as needed for breakthrough pain along with oral Percocet every 4 hours as needed Continue IV Zosyn Repeat lactic acid, check CRP and procalcitonin Tolerated clear liquids therefore will advance to regular diet. IV Toradol with PPI   Appreciate assistance of GYN.  They will see formally in consultation today.  No surgical indications at this time. Appreciate evaluation of general surgery and also no acute surgical interventions indicated at this time Please note hemoglobin at baseline of around 10-11 Resume patient's birth control pills  MDD/GAD/ADHD Patient was not taking medications for this prior to admission  Chronic hepatitis C LFTs currently normal    Advance Care Planning:   Code Status: Full Code   VTE prophylaxis: SCDs in context of hemoperitoneum from hemorrhagic ovarian cyst  Consults: OB/GYN, general surgery  Family Communication: Patient only  Severity of  Illness: The appropriate patient status for this patient is OBSERVATION. Observation status is judged to be reasonable and necessary in order to provide the required intensity of service to ensure the patient's safety. The patient's presenting symptoms, physical exam findings, and initial radiographic and laboratory data in the context of their medical condition is felt to place them at decreased risk for further clinical deterioration. Furthermore, it is anticipated that the patient will be medically stable for discharge from the hospital within 2 midnights of admission.   Author: Junious Silk, NP 01/05/2024 10:51 AM  For on call review [email or web address].

## 2024-01-05 NOTE — Consult Note (Signed)
 Reason for Consult:pelvic pain Referring Physician: Rennis Harding NP  Tracy Stout is an 28 y.o. female. G1P1001 [name] last menstrual period was 11/13/2023 (exact date). Patient returned to [facility] via EMS due to increased abdominal/pelvic pain. She was discharged from our service by Dr. Vergie Living 4/4 after observation due to pain and possible hemorrhagic ovarian cyst with hemoperitoneum. She felt better with less pain until 4/8 and pain more acute and subjective fever was reported. Fever 101.9 in ED. Medicine service admitted patient for antibiotics with diagnosis of PID and we were consulted. F/u appointment with [facility] in 2 weeks. Patient is taking birth control pills.  Pertinent Gynecological History: Contraception: OCP (estrogen/progesterone) Blood transfusions: none Sexually transmitted diseases: no past history Previous GYN Procedures:  no    OB History: G1, P1   Menstrual History:  [name] last menstrual period was 11/13/2023 (exact date).    Past Medical History:  Diagnosis Date   ADHD (attention deficit hyperactivity disorder)    Anxiety    Bipolar 1 disorder (HCC)    Borderline personality disorder (HCC)    Depression    History of pyelonephritis during pregnancy 10/06/2016   Admitted 10/06/16> progressed to pneumonia/ARDS> tx to [facility], on ventilator for few days, d/c'd 10/15/16       On Keflex suppression     Pneumonia    Polysubstance abuse (HCC)    PTSD (post-traumatic stress disorder)    Self-inflicted injury    Suicide attempt [facility])     Past Surgical History:  Procedure Laterality Date   tubes in ears      Family History  Problem Relation Age of Onset   Hypertension Paternal Grandmother    Bipolar disorder Paternal Grandmother    Depression Maternal Grandmother    Stroke Maternal Grandfather        X 5   Bipolar disorder Father    ADD / ADHD Father    Hypertension Father    Drug abuse Father        drug overdose   Depression Father    Depression  Mother    ADD / ADHD Brother    Bipolar disorder Sister    Depression Sister    ADD / ADHD Sister    Other Sister        heart condition; had open heart surgery    Social History:  reports that she has been smoking cigarettes. She has a 3 pack-year smoking history. She has never used smokeless tobacco. She reports that she does not currently use alcohol. She reports current drug use. Drug: Marijuana.  Allergies: No Known Allergies  Medications: I have reviewed the patient's current medications.  Review of Systems  Constitutional:  Positive for fever.  Gastrointestinal:  Positive for abdominal pain. Negative for abdominal distention, diarrhea and nausea.  Genitourinary:  Positive for pelvic pain. Negative for dysuria, vaginal bleeding and vaginal discharge.    Blood pressure (!) 96/48, pulse 63, temperature 98 F (36.7 C), temperature source Axillary, resp. rate 18, height 5\' 8"  (1.727 m), weight 73.5 kg, last menstrual period 11/13/2023, SpO2 100%. Physical Exam Constitutional:      Appearance: She is well-developed. She is ill-appearing.  HENT:     Head: Normocephalic and atraumatic.  Cardiovascular:     Rate and Rhythm: Normal rate.  Pulmonary:     Effort: Pulmonary effort is normal.  Abdominal:     General: Abdomen is flat.     Tenderness: There is abdominal tenderness in the right lower quadrant, suprapubic area  and left lower quadrant.  Skin:    General: Skin is warm and dry.  Neurological:     Mental Status: She is alert.  Psychiatric:        Mood and Affect: Mood is anxious.     Results for orders placed or performed during the hospital encounter of 01/04/24 (from the past 48 hours)  Blood Culture (routine x 2)     Status: None (Preliminary result)   Collection Time: 01/04/24  9:26 PM   Specimen: BLOOD RIGHT ARM  Result Value Ref Range   Specimen Description      BLOOD RIGHT ARM Performed at [facility], [address] [place], [place] [address]     Special Requests      BOTTLES DRAWN AEROBIC AND ANAEROBIC Blood Culture results may not be optimal due to an inadequate volume of blood received in culture bottles Performed at [facility], [address] [place], [place] [address]    Culture PENDING    Report Status PENDING   Lactic acid, plasma     Status: None   Collection Time: 01/04/24  9:43 PM  Result Value Ref Range   Lactic Acid, Venous 0.5 0.5 - 1.9 mmol/L    Comment: Performed at [facility], [address], [place], [place] [address]  Comprehensive metabolic panel     Status: Abnormal   Collection Time: 01/04/24  9:43 PM  Result Value Ref Range   Sodium 137 135 - 145 mmol/L   Potassium 4.0 3.5 - 5.1 mmol/L   Chloride 104 98 - 111 mmol/L   CO2 25 22 - 32 mmol/L   Glucose, Bld 83 70 - 99 mg/dL    Comment: Glucose reference range applies only to samples taken after fasting for at least 8 hours.   BUN 15 6 - 20 mg/dL   Creatinine, Ser 9.14 0.44 - 1.00 mg/dL   Calcium 8.5 (L) 8.9 - 10.3 mg/dL   Total Protein 6.9 6.5 - 8.1 g/dL   Albumin 4.2 3.5 - 5.0 g/dL   AST 27 15 - 41 U/L   ALT 12 0 - 44 U/L   Alkaline Phosphatase 43 38 - 126 U/L   Total Bilirubin 0.9 0.0 - 1.2 mg/dL   GFR, Estimated >78 >29 mL/min    Comment: (NOTE) Calculated using the CKD-EPI Creatinine Equation (2021)    Anion gap 8 5 - 15    Comment: Performed at [facility], [address] [place], [place] [address]  CBC with Differential     Status: Abnormal   Collection Time: 01/04/24  9:43 PM  Result Value Ref Range   WBC 3.8 (L) 4.0 - 10.5 K/uL   RBC 3.14 (L) 3.87 - 5.11 MIL/uL   Hemoglobin 10.2 (L) 12.0 - 15.0 g/dL   HCT 08.6 (L) 57.8 - 46.9 %   MCV 94.3 80.0 - 100.0 fL   MCH 32.5 26.0 - 34.0 pg   MCHC 34.5 30.0 - 36.0 g/dL   RDW 62.9 52.8 - 41.3 %   Platelets 182 150 - 400 K/uL   nRBC 0.0 0.0 - 0.2 %   Neutrophils Relative % 54 %   Neutro Abs 2.1 1.7 - 7.7 K/uL   Lymphocytes  Relative 32 %   Lymphs Abs 1.2 0.7 - 4.0 K/uL   Monocytes Relative 12 %   Monocytes Absolute 0.5 0.1 - 1.0 K/uL   Eosinophils Relative 1 %   Eosinophils Absolute 0.0 0.0 - 0.5 K/uL  Basophils Relative 1 %   Basophils Absolute 0.0 0.0 - 0.1 K/uL   Immature Granulocytes 0 %   Abs Immature Granulocytes 0.01 0.00 - 0.07 K/uL    Comment: Performed at [facility], [address], [place], [place] [address]  Pregnancy, urine     Status: None   Collection Time: 01/04/24  9:43 PM  Result Value Ref Range   Preg Test, Ur NEGATIVE NEGATIVE    Comment:        THE SENSITIVITY OF THIS METHODOLOGY IS >25 mIU/mL. Performed at [facility], [address], [place], [place] [address]   Lipase, blood     Status: None   Collection Time: 01/04/24  9:43 PM  Result Value Ref Range   Lipase 28 11 - 51 U/L    Comment: Performed at [facility], [address] [place], [place] [address]  Urinalysis, w/ Reflex to Culture (Infection Suspected) -Urine, Clean Catch     Status: Abnormal   Collection Time: 01/04/24  9:43 PM  Result Value Ref Range   Specimen Source URINE, CLEAN CATCH    Color, Urine YELLOW YELLOW   APPearance CLEAR CLEAR   Specific Gravity, Urine 1.023 1.005 - 1.030   pH 6.5 5.0 - 8.0   Glucose, UA NEGATIVE NEGATIVE mg/dL   Hgb urine dipstick SMALL (A) NEGATIVE   Bilirubin Urine NEGATIVE NEGATIVE   Ketones, ur NEGATIVE NEGATIVE mg/dL   Protein, ur NEGATIVE NEGATIVE mg/dL   Nitrite NEGATIVE NEGATIVE   Leukocytes,Ua NEGATIVE NEGATIVE   RBC / HPF 6-10 0 - 5 RBC/hpf   WBC, UA 0-5 0 - 5 WBC/hpf    Comment:        Reflex urine culture not performed if WBC <=10, OR if Squamous epithelial cells >5. If Squamous epithelial cells >5 suggest recollection.    Bacteria, UA NONE SEEN NONE SEEN   Squamous Epithelial / HPF 0-5 0 - 5 /HPF    Comment: Performed at [facility], [address], [place], [place] [address]   Resp panel by RT-PCR (RSV, Flu A&B, Covid) Anterior Nasal Swab     Status: None   Collection Time: 01/04/24 10:11 PM   Specimen: Anterior Nasal Swab  Result Value Ref Range   SARS Coronavirus 2 by RT PCR NEGATIVE NEGATIVE    Comment: (NOTE) SARS-CoV-2 target nucleic acids are NOT DETECTED.  The SARS-CoV-2 RNA is generally detectable in upper respiratory specimens during the acute phase of infection. The lowest concentration of SARS-CoV-2 viral copies this assay can detect is 138 copies/mL. A negative result does not preclude SARS-Cov-2 infection and should not be used as the sole basis for treatment or other patient management decisions. A negative result may occur with  improper specimen collection/handling, submission of specimen other than nasopharyngeal swab, presence of viral mutation(s) within the areas targeted by this assay, and inadequate number of viral copies(<138 copies/mL). A negative result must be combined with clinical observations, patient history, and epidemiological information. The expected result is Negative.  Fact Sheet for Patients:  BloggerCourse.com  Fact Sheet for Healthcare Providers:  SeriousBroker.it  This test is no t yet approved or cleared by the [place] FDA and  has been authorized for detection and/or diagnosis of SARS-CoV-2 by FDA under an Emergency Use Authorization (EUA). This EUA will remain  in effect (meaning this test can be used) for the duration of the COVID-19 declaration under Section 564(b)(1) of the Act, 21 U.S.C.section 360bbb-3(b)(1), unless the authorization is terminated  or revoked sooner.       Influenza A by PCR NEGATIVE NEGATIVE   Influenza B by PCR NEGATIVE NEGATIVE    Comment: (NOTE) The Xpert Xpress SARS-CoV-2/FLU/RSV plus assay is intended as an aid in the diagnosis of influenza from Nasopharyngeal swab specimens and should not be used as a sole basis for  treatment. Nasal washings and aspirates are unacceptable for Xpert Xpress SARS-CoV-2/FLU/RSV testing.  Fact Sheet for Patients: BloggerCourse.com  Fact Sheet for Healthcare Providers: SeriousBroker.it  This test is not yet approved or cleared by the [place] FDA and has been authorized for detection and/or diagnosis of SARS-CoV-2 by FDA under an Emergency Use Authorization (EUA). This EUA will remain in effect (meaning this test can be used) for the duration of the COVID-19 declaration under Section 564(b)(1) of the Act, 21 U.S.C. section 360bbb-3(b)(1), unless the authorization is terminated or revoked.     Resp Syncytial Virus by PCR NEGATIVE NEGATIVE    Comment: (NOTE) Fact Sheet for Patients: BloggerCourse.com  Fact Sheet for Healthcare Providers: SeriousBroker.it  This test is not yet approved or cleared by the [place] FDA and has been authorized for detection and/or diagnosis of SARS-CoV-2 by FDA under an Emergency Use Authorization (EUA). This EUA will remain in effect (meaning this test can be used) for the duration of the COVID-19 declaration under Section 564(b)(1) of the Act, 21 U.S.C. section 360bbb-3(b)(1), unless the authorization is terminated or revoked.  Performed at [facility], [address], [place], [place] [address]     CT ABDOMEN PELVIS W CONTRAST Result Date: 01/04/2024 CLINICAL DATA:  Pelvic pain with fever EXAM: CT ABDOMEN AND PELVIS WITH CONTRAST TECHNIQUE: Multidetector CT imaging of the abdomen and pelvis was performed using the standard protocol following bolus administration of intravenous contrast. RADIATION DOSE REDUCTION: This exam was performed according to the departmental dose-optimization program which includes automated exposure control, adjustment of the mA and/or kV according to patient size and/or use of  iterative reconstruction technique. CONTRAST:  85mL OMNIPAQUE IOHEXOL 300 MG/ML  SOLN COMPARISON:  CT 12/28/2023, ultrasound 12/28/2023 FINDINGS: Lower chest: Lung bases demonstrate no acute airspace disease. Hepatobiliary: No focal liver abnormality is seen. No gallstones, gallbladder wall thickening, or biliary dilatation. Pancreas: Unremarkable. No pancreatic ductal dilatation or surrounding inflammatory changes. Spleen: Normal in size without focal abnormality. Adrenals/Urinary Tract: Adrenal glands are unremarkable. Kidneys are normal, without renal calculi, focal lesion, or hydronephrosis. Few gas bubbles in the bladder. Bladder slightly thick walled. Stomach/Bowel: Stomach is within normal limits. Appendix appears normal. No evidence of bowel wall thickening, distention, or inflammatory changes. Vascular/Lymphatic: No significant vascular findings are present. No enlarged abdominal or pelvic lymph nodes. Reproductive: Uterus is unremarkable. Residual moderate to large hemoperitoneum in the pelvis and left greater than right adnexa. No rim enhancing fluid collection to suggest a pelvic abscess. Tampon in the vagina. Other: No free air. Interval decrease in hyperdense fluid surrounding the liver with trace fluid noted. Decreased lower quadrant free fluid and decreased pelvic hemoperitoneum with moderate residual. Musculoskeletal: No acute osseous abnormality IMPRESSION: 1. Residual moderate to large hemoperitoneum in the pelvis and left greater than right adnexa. No rim enhancing fluid collection to suggest a pelvic abscess. 2. Few gas bubbles in the bladder, correlate for recent instrumentation. Mild bladder wall thickening, nonspecific but correlate for cystitis 3. Interval decrease in hyperdense fluid surrounding the liver with trace fluid noted. Decreased lower quadrant free fluid and decreased pelvic hemoperitoneum compared with the prior exam. Electronically Signed  By: Jasmine Pang M.D.   On:  01/04/2024 23:31   DG Chest Port 1 View Result Date: 01/04/2024 CLINICAL DATA:  Possible sepsis EXAM: PORTABLE CHEST 1 VIEW COMPARISON:  06/22/2023 FINDINGS: Central airways thickening. No acute airspace disease, pleural effusion or pneumothorax. Normal cardiac size. IMPRESSION: Central airways thickening suggesting bronchitis or reactive airways. No focal pneumonia. Electronically Signed   By: Jasmine Pang M.D.   On: 01/04/2024 23:25    Assessment/Plan: Continue Zosyn for presumptive PID, no imaging showing any pelvic abscess. I added Toradol for pain management and patient may eat as no surgery is planned and she is hungry. We will follow daily, thank you for your care.  Scheryl Darter 01/05/2024

## 2024-01-05 NOTE — Progress Notes (Signed)
 Pt arrived via EMS on stretcher at [phone]. A/ox4. C/o of pain 6/10. Respirations even and unlabored on RA. Nurse completed head to toe assessement. Skin intact, 17 week old tattoo to RLE. IV r Ac. Started IV ABT. Gave report to oncoming nurse.

## 2024-01-06 DIAGNOSIS — Z823 Family history of stroke: Secondary | ICD-10-CM | POA: Diagnosis not present

## 2024-01-06 DIAGNOSIS — N739 Female pelvic inflammatory disease, unspecified: Secondary | ICD-10-CM | POA: Diagnosis present

## 2024-01-06 DIAGNOSIS — F411 Generalized anxiety disorder: Secondary | ICD-10-CM | POA: Diagnosis present

## 2024-01-06 DIAGNOSIS — Z813 Family history of other psychoactive substance abuse and dependence: Secondary | ICD-10-CM | POA: Diagnosis not present

## 2024-01-06 DIAGNOSIS — Z8249 Family history of ischemic heart disease and other diseases of the circulatory system: Secondary | ICD-10-CM | POA: Diagnosis not present

## 2024-01-06 DIAGNOSIS — F1290 Cannabis use, unspecified, uncomplicated: Secondary | ICD-10-CM | POA: Diagnosis present

## 2024-01-06 DIAGNOSIS — K661 Hemoperitoneum: Secondary | ICD-10-CM

## 2024-01-06 DIAGNOSIS — F17210 Nicotine dependence, cigarettes, uncomplicated: Secondary | ICD-10-CM | POA: Diagnosis present

## 2024-01-06 DIAGNOSIS — Z818 Family history of other mental and behavioral disorders: Secondary | ICD-10-CM | POA: Diagnosis not present

## 2024-01-06 DIAGNOSIS — F329 Major depressive disorder, single episode, unspecified: Secondary | ICD-10-CM | POA: Diagnosis present

## 2024-01-06 DIAGNOSIS — K659 Peritonitis, unspecified: Secondary | ICD-10-CM | POA: Diagnosis present

## 2024-01-06 DIAGNOSIS — R7881 Bacteremia: Secondary | ICD-10-CM | POA: Diagnosis present

## 2024-01-06 DIAGNOSIS — Z9152 Personal history of nonsuicidal self-harm: Secondary | ICD-10-CM | POA: Diagnosis not present

## 2024-01-06 DIAGNOSIS — Z79899 Other long term (current) drug therapy: Secondary | ICD-10-CM | POA: Diagnosis not present

## 2024-01-06 DIAGNOSIS — Z1152 Encounter for screening for COVID-19: Secondary | ICD-10-CM | POA: Diagnosis not present

## 2024-01-06 DIAGNOSIS — F4310 Post-traumatic stress disorder, unspecified: Secondary | ICD-10-CM | POA: Diagnosis present

## 2024-01-06 DIAGNOSIS — F909 Attention-deficit hyperactivity disorder, unspecified type: Secondary | ICD-10-CM | POA: Diagnosis present

## 2024-01-06 DIAGNOSIS — N735 Female pelvic peritonitis, unspecified: Secondary | ICD-10-CM | POA: Diagnosis not present

## 2024-01-06 DIAGNOSIS — B182 Chronic viral hepatitis C: Secondary | ICD-10-CM | POA: Diagnosis not present

## 2024-01-06 LAB — LACTIC ACID, PLASMA: Lactic Acid, Venous: 0.9 mmol/L (ref 0.5–1.9)

## 2024-01-06 LAB — BASIC METABOLIC PANEL WITH GFR
Anion gap: 9 (ref 5–15)
BUN: 9 mg/dL (ref 6–20)
CO2: 24 mmol/L (ref 22–32)
Calcium: 8.2 mg/dL — ABNORMAL LOW (ref 8.9–10.3)
Chloride: 103 mmol/L (ref 98–111)
Creatinine, Ser: 0.88 mg/dL (ref 0.44–1.00)
GFR, Estimated: >60 mL/min (ref >60)
Glucose, Bld: 148 mg/dL — ABNORMAL HIGH (ref 70–99)
Potassium: 3.6 mmol/L (ref 3.5–5.1)
Sodium: 136 mmol/L (ref 135–145)

## 2024-01-06 LAB — CBC WITH DIFFERENTIAL/PLATELET
Abs Immature Granulocytes: 0.01 10*3/uL (ref 0.00–0.07)
Basophils Absolute: 0 10*3/uL (ref 0.0–0.1)
Basophils Relative: 1 %
Eosinophils Absolute: 0.2 10*3/uL (ref 0.0–0.5)
Eosinophils Relative: 5 %
HCT: 27.1 % — ABNORMAL LOW (ref 36.0–46.0)
Hemoglobin: 9.1 g/dL — ABNORMAL LOW (ref 12.0–15.0)
Immature Granulocytes: 0 %
Lymphocytes Relative: 37 %
Lymphs Abs: 1.3 10*3/uL (ref 0.7–4.0)
MCH: 32.3 pg (ref 26.0–34.0)
MCHC: 33.6 g/dL (ref 30.0–36.0)
MCV: 96.1 fL (ref 80.0–100.0)
Monocytes Absolute: 0.3 10*3/uL (ref 0.1–1.0)
Monocytes Relative: 8 %
Neutro Abs: 1.7 10*3/uL (ref 1.7–7.7)
Neutrophils Relative %: 49 %
Platelets: 147 10*3/uL — ABNORMAL LOW (ref 150–400)
RBC: 2.82 MIL/uL — ABNORMAL LOW (ref 3.87–5.11)
RDW: 12.4 % (ref 11.5–15.5)
WBC: 3.5 10*3/uL — ABNORMAL LOW (ref 4.0–10.5)
nRBC: 0 % (ref 0.0–0.2)

## 2024-01-06 LAB — CBC
HCT: 26.4 % — ABNORMAL LOW (ref 36.0–46.0)
Hemoglobin: 8.8 g/dL — ABNORMAL LOW (ref 12.0–15.0)
MCH: 31.9 pg (ref 26.0–34.0)
MCHC: 33.3 g/dL (ref 30.0–36.0)
MCV: 95.7 fL (ref 80.0–100.0)
Platelets: 154 10*3/uL (ref 150–400)
RBC: 2.76 MIL/uL — ABNORMAL LOW (ref 3.87–5.11)
RDW: 12.3 % (ref 11.5–15.5)
WBC: 3.7 10*3/uL — ABNORMAL LOW (ref 4.0–10.5)
nRBC: 0 % (ref 0.0–0.2)

## 2024-01-06 LAB — URINE CULTURE

## 2024-01-06 MED ORDER — KCL IN DEXTROSE-NACL 20-5-0.9 MEQ/L-%-% IV SOLN: INTRAVENOUS | Status: DC

## 2024-01-06 MED ORDER — POTASSIUM CHLORIDE 2 MEQ/ML IV SOLN
Freq: Once | INTRAVENOUS | Status: AC
  Filled 2024-01-06: qty 1000

## 2024-01-06 MED ORDER — SODIUM CHLORIDE 0.9 % IV BOLUS
500.0000 mL | Freq: Once | INTRAVENOUS | Status: AC
  Administered 2024-01-06: 500 mL via INTRAVENOUS

## 2024-01-06 MED ORDER — IBUPROFEN 200 MG PO TABS: 600.0000 mg | ORAL_TABLET | Freq: Four times a day (QID) | ORAL | Status: DC | PRN

## 2024-01-06 MED ORDER — SODIUM CHLORIDE 0.9 % IV SOLN
100.0000 mg | Freq: Two times a day (BID) | INTRAVENOUS | Status: DC
  Administered 2024-01-06 – 2024-01-08 (×5): 100 mg via INTRAVENOUS
  Filled 2024-01-06 (×5): qty 100

## 2024-01-06 NOTE — Plan of Care (Signed)

## 2024-01-06 NOTE — Progress Notes (Signed)
 Subjective:  Patient reports tolerating PO and no problems voiding.   Lower abdominal pain,appetite is good, no F&C Objective: I have reviewed patient's vital signs, medications, labs, and radiology results. Blood pressure 111/71, pulse 84, temperature 98.9 F (37.2 C), temperature source Oral, resp. rate 18, height 5\' 8"  (1.727 m), weight 73.5 kg, last menstrual period 11/13/2023, SpO2 99%.  General: alert, cooperative, and no distress Resp: normal effort Cardio: regular rate and rhythm GI: tender bilateral lower quadrants with guarding Extremities: extremities normal, atraumatic, no cyanosis or edema CBC    Component Value Date/Time   WBC 3.5 (L) 01/06/2024 0931   RBC 2.82 (L) 01/06/2024 0931   HGB 9.1 (L) 01/06/2024 0931   HGB 9.8 (L) 11/30/2016 0927   HCT 27.1 (L) 01/06/2024 0931   HCT 30.0 (L) 11/30/2016 0927   PLT 147 (L) 01/06/2024 0931   PLT 157 11/30/2016 0927   MCV 96.1 01/06/2024 0931   MCV 98 (H) 11/30/2016 0927   MCH 32.3 01/06/2024 0931   MCHC 33.6 01/06/2024 0931   RDW 12.4 01/06/2024 0931   RDW 15.2 11/30/2016 0927   LYMPHSABS 1.3 01/06/2024 0931   MONOABS 0.3 01/06/2024 0931   EOSABS 0.2 01/06/2024 0931   BASOSABS 0.0 01/06/2024 0931     Assessment/Plan: Met sepsis criteria but WBC is normalizing and not febrile. Continue Zosyn and repeat labs tomorrow. Change to Toradol po  LOS: 0 days    Onnie Bilis, MD 01/06/2024, 10:32 AM

## 2024-01-06 NOTE — Plan of Care (Signed)
  Problem: Education: Goal: Knowledge of General Education information will improve Description: Including pain rating scale, medication(s)/side effects and non-pharmacologic comfort measures Outcome: Progressing   Problem: Health Behavior/Discharge Planning: Goal: Ability to manage health-related needs will improve Outcome: Progressing   Problem: Clinical Measurements: Goal: Ability to maintain clinical measurements within normal limits will improve Outcome: Progressing Goal: Will remain free from infection Outcome: Progressing   Problem: Safety: Goal: Ability to remain free from injury will improve Outcome: Progressing   

## 2024-01-06 NOTE — Hospital Course (Signed)
 Tracy Stout is a 28 y.o. female with a history of major depression, general anxiety disorder, ADHD, chronic hepatitis C.  Patient presented secondary to abdominal pain and was found to have evidence of hemoperitoneum on CT imaging with concern for PID.[name] OG/GYN consulted. Empiric antibiotics started.

## 2024-01-06 NOTE — Progress Notes (Signed)
 PROGRESS NOTE    Tracy Stout  UEA:[id number] DOB: [birth year] DOA: 01/04/2024 PCP: Patient, No Pcp Per   Brief Narrative: Tracy Stout is a 28 y.o. female with a history of major depression, general anxiety disorder, ADHD, chronic hepatitis C.  Patient presented secondary to abdominal pain and was found to have evidence of hemoperitoneum on CT imaging with concern for PID.[name] OG/GYN consulted. Empiric antibiotics started.   Assessment and Plan:  Hemorrhagic cyst with hemoperitoneum PID Patient recently admitted for similar presentation.  OB/GYN was consulted.  Patient started empirically on Zosyn IV for treatment. - OB/GYN recommendations: Continue Zosyn IV and doxycycline  Major depressive disorder Generalized anxiety disorder ADHD Noted.  Chronic hepatitis C Noted.   DVT prophylaxis: SCDs Code Status:   Code Status: Full Code Family Communication: None at bedside Disposition Plan: Discharge home possibly in 24 hours pending ongoing OB/GYN recommendations   Consultants:  OB/GYN  Procedures:  None  Antimicrobials: Zosyn IV Doxycycline    Subjective: Patient reports some improvement in abdominal pain.  Objective: BP 111/71   Pulse 84   Temp 98.9 F (37.2 C) (Oral)   Resp 18   Ht 5\' 8"  (1.727 m)   Wt 73.5 kg   LMP 11/13/2023 (Exact Date)   SpO2 99%   BMI 24.63 kg/m   Examination:  General exam: Appears calm and comfortable Respiratory system: Clear to auscultation. Respiratory effort normal. Cardiovascular system: S1 & S2 heard, RRR. No murmurs. Gastrointestinal system: Abdomen is nondistended, soft and tender. Normal bowel sounds heard. Central nervous system: Alert and oriented. No focal neurological deficits. Musculoskeletal: No edema. No calf tenderness Psychiatry: Judgement and insight appear normal. Mood & affect appropriate.    Data Reviewed: I have personally reviewed following labs and imaging studies  CBC Lab Results  Component Value  Date   WBC 3.5 (L) 01/06/2024   RBC 2.82 (L) 01/06/2024   HGB 9.1 (L) 01/06/2024   HCT 27.1 (L) 01/06/2024   MCV 96.1 01/06/2024   MCH 32.3 01/06/2024   PLT 147 (L) 01/06/2024   MCHC 33.6 01/06/2024   RDW 12.4 01/06/2024   LYMPHSABS 1.3 01/06/2024   MONOABS 0.3 01/06/2024   EOSABS 0.2 01/06/2024   BASOSABS 0.0 01/06/2024     Last metabolic panel Lab Results  Component Value Date   NA 136 01/06/2024   K 3.6 01/06/2024   CL 103 01/06/2024   CO2 24 01/06/2024   BUN 9 01/06/2024   CREATININE 0.88 01/06/2024   GLUCOSE 148 (H) 01/06/2024   GFRNONAA >60 01/06/2024   GFRAA 132 03/06/2019   CALCIUM 8.2 (L) 01/06/2024   PHOS 3.9 08/12/2018   PROT 6.9 01/04/2024   ALBUMIN 4.2 01/04/2024   BILITOT 0.9 01/04/2024   ALKPHOS 43 01/04/2024   AST 27 01/04/2024   ALT 12 01/04/2024   ANIONGAP 9 01/06/2024    GFR: Estimated Creatinine Clearance: 98.6 mL/min (by C-G formula based on SCr of 0.88 mg/dL).  Recent Results (from the past 240 hours)  Wet prep, genital     Status: None   Collection Time: 12/28/23  3:44 PM   Specimen: PATH Cytology Cervicovaginal Ancillary Only  Result Value Ref Range Status   Yeast Wet Prep HPF POC NONE SEEN NONE SEEN Final   Trich, Wet Prep NONE SEEN NONE SEEN Final   Clue Cells Wet Prep HPF POC NONE SEEN NONE SEEN Final   WBC, Wet Prep HPF POC <10 <10 Final   Sperm NONE SEEN  Final  Comment: Performed at [facility], [address], [place], [place] [address]  Blood Culture (routine x 2)     Status: None (Preliminary result)   Collection Time: 01/04/24  9:26 PM   Specimen: BLOOD RIGHT ARM  Result Value Ref Range Status   Specimen Description   Final    BLOOD RIGHT ARM Performed at [facility], [address], [place], [place] [address]    Special Requests   Final    BOTTLES DRAWN AEROBIC AND ANAEROBIC Blood Culture results may not be optimal due to an inadequate volume of blood received in culture bottles Performed at [facility], [address] [place], [place] [address]    Culture   Final    NO GROWTH < 24 HOURS Performed at [facility], [address] [place], [place] [address]    Report Status PENDING  Incomplete  Resp panel by RT-PCR (RSV, Flu A&B, Covid) Anterior Nasal Swab     Status: None   Collection Time: 01/04/24 10:11 PM   Specimen: Anterior Nasal Swab  Result Value Ref Range Status   SARS Coronavirus 2 by RT PCR NEGATIVE NEGATIVE Final    Comment: (NOTE) SARS-CoV-2 target nucleic acids are NOT DETECTED.  The SARS-CoV-2 RNA is generally detectable in upper respiratory specimens during the acute phase of infection. The lowest concentration of SARS-CoV-2 viral copies this assay can detect is 138 copies/mL. A negative result does not preclude SARS-Cov-2 infection and should not be used as the sole basis for treatment or other patient management decisions. A negative result may occur with  improper specimen collection/handling, submission of specimen other than nasopharyngeal swab, presence of viral mutation(s) within the areas targeted by this assay, and inadequate number of viral copies(<138 copies/mL). A negative result must be combined with clinical observations, patient history, and epidemiological information. The expected result is Negative.  Fact Sheet for Patients:  BloggerCourse.com  Fact Sheet for Healthcare Providers:  SeriousBroker.it  This test is no t yet approved or cleared by the [place]  FDA and  has been authorized for detection and/or diagnosis of SARS-CoV-2 by FDA under an Emergency Use Authorization (EUA). This EUA will remain  in effect (meaning this test can be used) for the duration of the COVID-19 declaration under Section 564(b)(1) of the Act, 21 U.S.C.section 360bbb-3(b)(1), unless the authorization is terminated  or revoked sooner.       Influenza A by PCR NEGATIVE NEGATIVE  Final   Influenza B by PCR NEGATIVE NEGATIVE Final    Comment: (NOTE) The Xpert Xpress SARS-CoV-2/FLU/RSV plus assay is intended as an aid in the diagnosis of influenza from Nasopharyngeal swab specimens and should not be used as a sole basis for treatment. Nasal washings and aspirates are unacceptable for Xpert Xpress SARS-CoV-2/FLU/RSV testing.  Fact Sheet for Patients: BloggerCourse.com  Fact Sheet for Healthcare Providers: SeriousBroker.it  This test is not yet approved or cleared by the [place]  FDA and has been authorized for detection and/or diagnosis of SARS-CoV-2 by FDA under an Emergency Use Authorization (EUA). This EUA will remain in effect (meaning this test can be used) for the duration of the COVID-19 declaration under Section 564(b)(1) of the Act, 21 U.S.C. section 360bbb-3(b)(1), unless the authorization is terminated or revoked.     Resp Syncytial Virus by PCR NEGATIVE NEGATIVE Final    Comment: (NOTE) Fact Sheet for Patients: BloggerCourse.com  Fact Sheet for Healthcare Providers: SeriousBroker.it  This test is not yet approved  or cleared by the [place]  FDA and has been authorized for detection and/or diagnosis of SARS-CoV-2 by FDA under an Emergency Use Authorization (EUA). This EUA will remain in effect (meaning this test can be used) for the duration of the COVID-19 declaration under Section 564(b)(1) of the Act, 21 U.S.C. section 360bbb-3(b)(1), unless the authorization is terminated or revoked.  Performed at [facility], [address] [place], [place] [address]   Urine Culture     Status: Abnormal   Collection Time: 01/05/24 12:03 AM   Specimen: Urine, Clean Catch  Result Value Ref Range Status   Specimen Description   Final    URINE, CLEAN CATCH Performed at [facility], [address],  [place], [place] [address]    Special Requests   Final    NONE Performed at [facility], [address] [place], [place] [address]    Culture MULTIPLE SPECIES PRESENT, SUGGEST RECOLLECTION (A)  Final   Report Status 01/06/2024 FINAL  Final      Radiology Studies: CT ABDOMEN PELVIS W CONTRAST Result Date: 01/04/2024 CLINICAL DATA:  Pelvic pain with fever EXAM: CT ABDOMEN AND PELVIS WITH CONTRAST TECHNIQUE: Multidetector CT imaging of the abdomen and pelvis was performed using the standard protocol following bolus administration of intravenous contrast. RADIATION DOSE REDUCTION: This exam was performed according to the departmental dose-optimization program which includes automated exposure control, adjustment of the mA and/or kV according to patient size and/or use of iterative reconstruction technique. CONTRAST:  85mL OMNIPAQUE IOHEXOL 300 MG/ML  SOLN COMPARISON:  CT 12/28/2023, ultrasound 12/28/2023 FINDINGS: Lower chest: Lung bases demonstrate no acute airspace disease. Hepatobiliary: No focal liver abnormality is seen. No gallstones, gallbladder wall thickening, or biliary dilatation. Pancreas: Unremarkable. No pancreatic ductal dilatation or surrounding inflammatory changes. Spleen: Normal in size without focal abnormality. Adrenals/Urinary Tract: Adrenal glands are unremarkable. Kidneys are normal, without renal calculi, focal lesion, or hydronephrosis. Few gas bubbles in the bladder. Bladder slightly thick walled. Stomach/Bowel: Stomach is within normal limits. Appendix appears normal. No evidence of bowel wall thickening, distention, or inflammatory changes. Vascular/Lymphatic: No significant vascular findings are present. No enlarged abdominal or pelvic lymph nodes. Reproductive: Uterus is unremarkable. Residual moderate to large hemoperitoneum in the pelvis and left greater than right adnexa. No rim enhancing fluid collection to suggest a pelvic abscess. Tampon in the vagina.  Other: No free air. Interval decrease in hyperdense fluid surrounding the liver with trace fluid noted. Decreased lower quadrant free fluid and decreased pelvic hemoperitoneum with moderate residual. Musculoskeletal: No acute osseous abnormality IMPRESSION: 1. Residual moderate to large hemoperitoneum in the pelvis and left greater than right adnexa. No rim enhancing fluid collection to suggest a pelvic abscess. 2. Few gas bubbles in the bladder, correlate for recent instrumentation. Mild bladder wall thickening, nonspecific but correlate for cystitis 3. Interval decrease in hyperdense fluid surrounding the liver with trace fluid noted. Decreased lower quadrant free fluid and decreased pelvic hemoperitoneum compared with the prior exam. Electronically Signed   By: Esmeralda Hedge M.D.   On: 01/04/2024 23:31   DG Chest Port 1 View Result Date: 01/04/2024 CLINICAL DATA:  Possible sepsis EXAM: PORTABLE CHEST 1 VIEW COMPARISON:  06/22/2023 FINDINGS: Central airways thickening. No acute airspace disease, pleural effusion or pneumothorax. Normal cardiac size. IMPRESSION: Central airways thickening suggesting bronchitis or reactive airways. No focal pneumonia. Electronically Signed   By: Esmeralda Hedge M.D.   On: 01/04/2024 23:25      LOS: 0 days  Aneita Keens, MD [facility] 01/06/2024, 4:17 PM   If 7PM-7AM, please contact night-coverage [email or web address]

## 2024-01-07 DIAGNOSIS — N735 Female pelvic peritonitis, unspecified: Secondary | ICD-10-CM | POA: Diagnosis not present

## 2024-01-07 DIAGNOSIS — K661 Hemoperitoneum: Secondary | ICD-10-CM | POA: Diagnosis not present

## 2024-01-07 LAB — CBC
HCT: 28.4 % — ABNORMAL LOW (ref 36.0–46.0)
Hemoglobin: 9.6 g/dL — ABNORMAL LOW (ref 12.0–15.0)
MCH: 32.7 pg (ref 26.0–34.0)
MCHC: 33.8 g/dL (ref 30.0–36.0)
MCV: 96.6 fL (ref 80.0–100.0)
Platelets: 177 10*3/uL (ref 150–400)
RBC: 2.94 MIL/uL — ABNORMAL LOW (ref 3.87–5.11)
RDW: 12.3 % (ref 11.5–15.5)
WBC: 3.6 10*3/uL — ABNORMAL LOW (ref 4.0–10.5)
nRBC: 0 % (ref 0.0–0.2)

## 2024-01-07 LAB — LACTIC ACID, PLASMA: Lactic Acid, Venous: 0.5 mmol/L (ref 0.5–1.9)

## 2024-01-07 NOTE — Plan of Care (Signed)
  Problem: Education: Goal: Knowledge of General Education information will improve Description: Including pain rating scale, medication(s)/side effects and non-pharmacologic comfort measures Outcome: Progressing   Problem: Health Behavior/Discharge Planning: Goal: Ability to manage health-related needs will improve Outcome: Progressing   Problem: Clinical Measurements: Goal: Ability to maintain clinical measurements within normal limits will improve Outcome: Progressing Goal: Will remain free from infection Outcome: Progressing Goal: Diagnostic test results will improve Outcome: Progressing Goal: Cardiovascular complication will be avoided Outcome: Progressing   Problem: Coping: Goal: Level of anxiety will decrease Outcome: Progressing   Problem: Elimination: Goal: Will not experience complications related to urinary retention Outcome: Progressing   Problem: Pain Managment: Goal: General experience of comfort will improve and/or be controlled Outcome: Progressing   Problem: Safety: Goal: Ability to remain free from injury will improve Outcome: Progressing

## 2024-01-07 NOTE — Discharge Summary (Signed)
 Physician Discharge Summary   Patient: Tracy Stout MRN: [id number] DOB: 10-04-1995  Admit date:     01/04/2024  Discharge date: {dischdate:26783}  Discharge Physician: Aneita Keens   PCP: Patient, No Pcp Per   Recommendations at discharge:  {Tip this will not be part of the note when signed- Example include specific recommendations for outpatient follow-up, pending tests to follow-up on. (Optional):26781}  ***  Discharge Diagnoses: Principal Problem:   Peritonitis of abdominen and pelvis Active Problems:   Hemoperitoneum  Resolved Problems:   * No resolved hospital problems. *  Hospital Course: Tracy Stout is a 28 y.o. female with a history of major depression, general anxiety disorder, ADHD, chronic hepatitis C.  Patient presented secondary to abdominal pain and was found to have evidence of hemoperitoneum on CT imaging with concern for PID.[name] OG/GYN consulted. Empiric antibiotics started.  Assessment and Plan: No notes have been filed under this hospital service. Service: Hospitalist     {Tip this will not be part of the note when signed Body mass index is 24.63 kg/m. , ,  (Optional):26781}  {(NOTE) Pain control PDMP Statment (Optional):26782} Consultants: *** Procedures performed: ***  Disposition: {Plan; Disposition:26390} Diet recommendation:  {Diet_Plan:26776} DISCHARGE MEDICATION: Allergies as of 01/07/2024   No Known Allergies   Med Rec must be completed prior to using this [facility]***       Discharge Exam: Filed Weights   01/04/24 2013  Weight: 73.5 kg   ***  Condition at discharge: {DC Condition:26389}  The results of significant diagnostics from this hospitalization (including imaging, microbiology, ancillary and laboratory) are listed below for reference.   Imaging Studies: CT ABDOMEN PELVIS W CONTRAST Result Date: 01/04/2024 CLINICAL DATA:  Pelvic pain with fever EXAM: CT ABDOMEN AND PELVIS WITH CONTRAST TECHNIQUE: Multidetector CT imaging  of the abdomen and pelvis was performed using the standard protocol following bolus administration of intravenous contrast. RADIATION DOSE REDUCTION: This exam was performed according to the departmental dose-optimization program which includes automated exposure control, adjustment of the mA and/or kV according to patient size and/or use of iterative reconstruction technique. CONTRAST:  85mL OMNIPAQUE IOHEXOL 300 MG/ML  SOLN COMPARISON:  CT 12/28/2023, ultrasound 12/28/2023 FINDINGS: Lower chest: Lung bases demonstrate no acute airspace disease. Hepatobiliary: No focal liver abnormality is seen. No gallstones, gallbladder wall thickening, or biliary dilatation. Pancreas: Unremarkable. No pancreatic ductal dilatation or surrounding inflammatory changes. Spleen: Normal in size without focal abnormality. Adrenals/Urinary Tract: Adrenal glands are unremarkable. Kidneys are normal, without renal calculi, focal lesion, or hydronephrosis. Few gas bubbles in the bladder. Bladder slightly thick walled. Stomach/Bowel: Stomach is within normal limits. Appendix appears normal. No evidence of bowel wall thickening, distention, or inflammatory changes. Vascular/Lymphatic: No significant vascular findings are present. No enlarged abdominal or pelvic lymph nodes. Reproductive: Uterus is unremarkable. Residual moderate to large hemoperitoneum in the pelvis and left greater than right adnexa. No rim enhancing fluid collection to suggest a pelvic abscess. Tampon in the vagina. Other: No free air. Interval decrease in hyperdense fluid surrounding the liver with trace fluid noted. Decreased lower quadrant free fluid and decreased pelvic hemoperitoneum with moderate residual. Musculoskeletal: No acute osseous abnormality IMPRESSION: 1. Residual moderate to large hemoperitoneum in the pelvis and left greater than right adnexa. No rim enhancing fluid collection to suggest a pelvic abscess. 2. Few gas bubbles in the bladder, correlate for  recent instrumentation. Mild bladder wall thickening, nonspecific but correlate for cystitis 3. Interval decrease in hyperdense fluid surrounding the liver with trace fluid noted. Decreased  lower quadrant free fluid and decreased pelvic hemoperitoneum compared with the prior exam. Electronically Signed   By: Esmeralda Hedge M.D.   On: 01/04/2024 23:31   DG Chest Port 1 View Result Date: 01/04/2024 CLINICAL DATA:  Possible sepsis EXAM: PORTABLE CHEST 1 VIEW COMPARISON:  06/22/2023 FINDINGS: Central airways thickening. No acute airspace disease, pleural effusion or pneumothorax. Normal cardiac size. IMPRESSION: Central airways thickening suggesting bronchitis or reactive airways. No focal pneumonia. Electronically Signed   By: Esmeralda Hedge M.D.   On: 01/04/2024 23:25   US  PELVIC TRANSABD W/PELVIC DOPPLER Result Date: 12/28/2023 CLINICAL DATA:  Pelvic pain. Hemoperitoneum on CT imaging earlier today. Negative pregnancy test. EXAM: TRANSABDOMINAL ULTRASOUND OF PELVIS DOPPLER ULTRASOUND OF OVARIES TECHNIQUE: Transabdominal ultrasound examination of the pelvis was performed including evaluation of the uterus, ovaries, adnexal regions, and pelvic cul-de-sac. Color and duplex Doppler ultrasound was utilized to evaluate blood flow to the ovaries. COMPARISON:  CT scan from earlier same day FINDINGS: Uterus Measurements: 11.3 x 4.3 x 4.5 cm = volume: 113 mL. No fibroids or other mass visualized. Endometrium Thickness: 9 mm.  No focal abnormality visualized. Right ovary Measurements: 3.3 x 2.6 x 2.1 cm = volume: 9.3 mL. Normal appearance/no adnexal mass. Left ovary Measurements: 3.5 x 3.2 x 3.3 cm = volume: 19.4 mL. Left ovary is encased in echogenic material without demonstrable flow signal on Doppler imaging and likely representing blood clot. Pulsed Doppler evaluation demonstrates normal low-resistance arterial and venous waveforms in both ovaries. Other: Large volume complex free fluid identified in the pelvis, as noted  above with apparent clot in the left adnexal space. IMPRESSION: 1. Large volume complex free fluid in the pelvis with apparent clot in the left adnexal space, encasing the left ovary. Imaging features are compatible with hemoperitoneum. No features to suggest left ovarian torsion given demonstrable low resistance arterial and venous blood flow identified in the left ovary on pulsed Doppler evaluation. No adnexal mass lesion evident. No definite tubo-ovarian abscess. Given the negative pregnancy test, ruptured ectopic pregnancy is considered unlikely. Electronically Signed   By: Donnal Fusi M.D.   On: 12/28/2023 07:58   CT ABDOMEN PELVIS W CONTRAST Result Date: 12/28/2023 CLINICAL DATA:  Right lower quadrant abdominal pain.  Pelvic pain. EXAM: CT ABDOMEN AND PELVIS WITH CONTRAST TECHNIQUE: Multidetector CT imaging of the abdomen and pelvis was performed using the standard protocol following bolus administration of intravenous contrast. RADIATION DOSE REDUCTION: This exam was performed according to the departmental dose-optimization program which includes automated exposure control, adjustment of the mA and/or kV according to patient size and/or use of iterative reconstruction technique. CONTRAST:  OMNIPAQUE IOHEXOL 300 MG/ML  SOLN COMPARISON:  10/09/2016 FINDINGS: Lower chest: No acute findings. Hepatobiliary: No suspicious focal abnormality within the liver parenchyma. There is no evidence for gallstones, gallbladder wall thickening, or pericholecystic fluid. No intrahepatic or extrahepatic biliary dilation. Pancreas: No focal mass lesion. No dilatation of the main duct. No intraparenchymal cyst. No peripancreatic edema. Spleen: No splenomegaly. No suspicious focal mass lesion. Adrenals/Urinary Tract: No adrenal nodule or mass. Kidneys unremarkable. No evidence for hydroureter. The urinary bladder appears normal for the degree of distention. Stomach/Bowel: Stomach is unremarkable. No gastric wall  thickening. No evidence of outlet obstruction. Duodenum is normally positioned as is the ligament of Treitz. No small bowel wall thickening. No small bowel dilatation. The terminal ileum is normal. The appendix is not well visualized, but there is no edema or inflammation in the region of the cecal tip to  suggest appendicitis. No gross colonic mass. No colonic wall thickening. Vascular/Lymphatic: No abdominal aortic aneurysm. No abdominal aortic atherosclerotic calcification. There is no gastrohepatic or hepatoduodenal ligament lymphadenopathy. No retroperitoneal or mesenteric lymphadenopathy. No pelvic sidewall lymphadenopathy. Reproductive: The uterus is unremarkable. Neither ovary is discretely visible due to prominent free fluid in the pelvis. There does appear to be a 3.1 x 2.9 cm soft tissue structure adjacent to the left psoas muscle on 58/2 which could be a high left ovary. The left gonadal vessels do appear to extend into the inferior aspect of this finding. Other: Moderate to large volume free fluid in the abdomen and pelvis. Fluid adjacent to the liver and spleen has attenuation higher than would be expected for simple fluid. The fluid in the low pelvis (see image 71/2) has relatively high density consistent with blood products/clot or infectious debris. There is probably some layering clot in the left paracolic gutter (16/1). High density fluid is also seen in the anterior pelvis. Musculoskeletal: No worrisome lytic or sclerotic osseous abnormality. IMPRESSION: 1. Moderate to large volume free fluid in the abdomen and pelvis. Fluid adjacent to the liver and spleen has attenuation higher than would be expected for simple fluid. The fluid in the low pelvis has relatively high density consistent with blood products/clot or infectious debris with some probable clot layering in the left paracolic gutter. Generally, by CT imaging, appearance is most suggestive of hemoperitoneum. 2. Neither ovary is discretely  visible due to prominent free fluid in the pelvis. There does appear to be prominent soft tissue in the left adnexal space with a 3.1 x 2.9 cm soft tissue structure adjacent to the left psoas muscle which could be a high left ovary. In the absence of pregnancy, the main differential considerations would include ovarian torsion, ruptured hemorrhagic cyst, or tubo-ovarian abscess. Pelvic ultrasound recommended to further evaluate. Findings were discussed with Dr. Candelaria Chaco at approximately 0617 hours on 12/28/2023. Electronically Signed   By: Donnal Fusi M.D.   On: 12/28/2023 06:19    Microbiology: Results for orders placed or performed during the hospital encounter of 01/04/24  Blood Culture (routine x 2)     Status: None (Preliminary result)   Collection Time: 01/04/24  9:26 PM   Specimen: BLOOD RIGHT ARM  Result Value Ref Range Status   Specimen Description   Final    BLOOD RIGHT ARM Performed at [facility], [address] [place], [place] [address]    Special Requests   Final    BOTTLES DRAWN AEROBIC AND ANAEROBIC Blood Culture results may not be optimal due to an inadequate volume of blood received in culture bottles Performed at [facility], [address], [place], [place] [address]    Culture  Setup Time   Final    GRAM POSITIVE RODS ANAEROBIC BOTTLE ONLY CRITICAL RESULT CALLED TO, READ BACK BY AND VERIFIED WITH: J [facility]  01/07/24 MK Performed at [facility], [address], [place], [place] [address]    Culture GRAM POSITIVE RODS  Final   Report Status PENDING  Incomplete  Resp panel by RT-PCR (RSV, Flu A&B, Covid) Anterior Nasal Swab     Status: None   Collection Time: 01/04/24 10:11 PM   Specimen: Anterior Nasal Swab  Result Value Ref Range Status   SARS Coronavirus 2 by RT PCR NEGATIVE NEGATIVE Final    Comment: (NOTE) SARS-CoV-2 target nucleic acids are NOT DETECTED.  The SARS-CoV-2 RNA is generally detectable in upper  respiratory  specimens during the acute phase of infection. The lowest concentration of SARS-CoV-2 viral copies this assay can detect is 138 copies/mL. A negative result does not preclude SARS-Cov-2 infection and should not be used as the sole basis for treatment or other patient management decisions. A negative result may occur with  improper specimen collection/handling, submission of specimen other than nasopharyngeal swab, presence of viral mutation(s) within the areas targeted by this assay, and inadequate number of viral copies(<138 copies/mL). A negative result must be combined with clinical observations, patient history, and epidemiological information. The expected result is Negative.  Fact Sheet for Patients:  BloggerCourse.com  Fact Sheet for Healthcare Providers:  SeriousBroker.it  This test is no t yet approved or cleared by the [place]  FDA and  has been authorized for detection and/or diagnosis of SARS-CoV-2 by FDA under an Emergency Use Authorization (EUA). This EUA will remain  in effect (meaning this test can be used) for the duration of the COVID-19 declaration under Section 564(b)(1) of the Act, 21 U.S.C.section 360bbb-3(b)(1), unless the authorization is terminated  or revoked sooner.       Influenza A by PCR NEGATIVE NEGATIVE Final   Influenza B by PCR NEGATIVE NEGATIVE Final    Comment: (NOTE) The Xpert Xpress SARS-CoV-2/FLU/RSV plus assay is intended as an aid in the diagnosis of influenza from Nasopharyngeal swab specimens and should not be used as a sole basis for treatment. Nasal washings and aspirates are unacceptable for Xpert Xpress SARS-CoV-2/FLU/RSV testing.  Fact Sheet for Patients: BloggerCourse.com  Fact Sheet for Healthcare Providers: SeriousBroker.it  This test is not yet approved or cleared by the [place]  FDA and has been  authorized for detection and/or diagnosis of SARS-CoV-2 by FDA under an Emergency Use Authorization (EUA). This EUA will remain in effect (meaning this test can be used) for the duration of the COVID-19 declaration under Section 564(b)(1) of the Act, 21 U.S.C. section 360bbb-3(b)(1), unless the authorization is terminated or revoked.     Resp Syncytial Virus by PCR NEGATIVE NEGATIVE Final    Comment: (NOTE) Fact Sheet for Patients: BloggerCourse.com  Fact Sheet for Healthcare Providers: SeriousBroker.it  This test is not yet approved or cleared by the [place]  FDA and has been authorized for detection and/or diagnosis of SARS-CoV-2 by FDA under an Emergency Use Authorization (EUA). This EUA will remain in effect (meaning this test can be used) for the duration of the COVID-19 declaration under Section 564(b)(1) of the Act, 21 U.S.C. section 360bbb-3(b)(1), unless the authorization is terminated or revoked.  Performed at [facility], [address] [place], [place] [address]   Urine Culture     Status: Abnormal   Collection Time: 01/05/24 12:03 AM   Specimen: Urine, Clean Catch  Result Value Ref Range Status   Specimen Description   Final    URINE, CLEAN CATCH Performed at [facility], [address], [place], [place] [address]    Special Requests   Final    NONE Performed at [facility], [address] [place], [place] [address]    Culture MULTIPLE SPECIES PRESENT, SUGGEST RECOLLECTION (A)  Final   Report Status 01/06/2024 FINAL  Final    Labs: CBC: Recent Labs  Lab 01/04/24 2143 01/05/24 1130 01/06/24 0931 01/06/24 2113  WBC 3.8* 2.4* 3.5* 3.7*  NEUTROABS 2.1 0.9* 1.7  --   HGB 10.2* 9.2* 9.1* 8.8*  HCT 29.6* 27.0* 27.1* 26.4*  MCV 94.3 96.1 96.1 95.7  PLT 182 144* 147* 154  Basic Metabolic Panel: Recent Labs  Lab 01/04/24 2143  01/05/24 1130 01/06/24 0931  NA 137 135 136  K 4.0 3.9 3.6  CL 104 102 103  CO2 25 26 24   GLUCOSE 83 97 148*  BUN 15 10 9   CREATININE 0.76 0.77 0.88  CALCIUM 8.5* 8.1* 8.2*   Liver Function Tests: Recent Labs  Lab 01/04/24 2143  AST 27  ALT 12  ALKPHOS 43  BILITOT 0.9  PROT 6.9  ALBUMIN 4.2   CBG: No results for input(s): "GLUCAP" in the last 168 hours.  Discharge time spent: {LESS THAN/GREATER ZOXW:96045} 30 minutes.  Signed: Aneita Keens, MD [facility] 01/07/2024

## 2024-01-07 NOTE — Progress Notes (Signed)
 PROGRESS NOTE    Tracy Stout  ZOX:[id number] DOB: Nov 30, 1995 DOA: 01/04/2024 PCP: Patient, No Pcp Per   Brief Narrative: Tracy Stout is a 28 y.o. female with a history of major depression, general anxiety disorder, ADHD, chronic hepatitis C.  Patient presented secondary to abdominal pain and was found to have evidence of hemoperitoneum on CT imaging with concern for PID.[name] OG/GYN consulted. Empiric antibiotics started.   Assessment and Plan:  Hemorrhagic cyst with hemoperitoneum PID Patient recently admitted for similar presentation.  OB/GYN was consulted.  Patient started empirically on Zosyn IV for treatment. - OB/GYN recommendations: Continue Zosyn IV and doxycycline  Major depressive disorder Generalized anxiety disorder ADHD Noted.  Chronic hepatitis C Noted.   DVT prophylaxis: SCDs Code Status:   Code Status: Full Code Family Communication: None at bedside Disposition Plan: Discharge home in 24 hours pending ongoing OB/GYN recommendations and transition to outpatient antibiotic regimen   Consultants:  OB/GYN  Procedures:  None  Antimicrobials: Zosyn IV Doxycycline    Subjective: Continued improvement in symptoms. No issues overnight.  Objective: BP 97/62 (BP Location: Right Arm)   Pulse 74   Temp 98.3 F (36.8 C) (Oral)   Resp 18   Ht 5\' 8"  (1.727 m)   Wt 73.5 kg   LMP 11/13/2023 (Exact Date)   SpO2 98%   BMI 24.63 kg/m   Examination:  General exam: Appears calm and comfortable Respiratory system: Clear to auscultation. Respiratory effort normal. Cardiovascular system: S1 & S2 heard, RRR. No murmurs, rubs, gallops or clicks. Gastrointestinal system: Abdomen is nondistended, soft. No organomegaly or masses felt. Normal bowel sounds heard. Central nervous system: Alert and oriented. No focal neurological deficits. Musculoskeletal: No edema. No calf tenderness Psychiatry: Judgement and insight appear normal. Mood & affect appropriate.      Data Reviewed: I have personally reviewed following labs and imaging studies  CBC Lab Results  Component Value Date   WBC 3.6 (L) 01/07/2024   RBC 2.94 (L) 01/07/2024   HGB 9.6 (L) 01/07/2024   HCT 28.4 (L) 01/07/2024   MCV 96.6 01/07/2024   MCH 32.7 01/07/2024   PLT 177 01/07/2024   MCHC 33.8 01/07/2024   RDW 12.3 01/07/2024   LYMPHSABS 1.3 01/06/2024   MONOABS 0.3 01/06/2024   EOSABS 0.2 01/06/2024   BASOSABS 0.0 01/06/2024     Last metabolic panel Lab Results  Component Value Date   NA 136 01/06/2024   K 3.6 01/06/2024   CL 103 01/06/2024   CO2 24 01/06/2024   BUN 9 01/06/2024   CREATININE 0.88 01/06/2024   GLUCOSE 148 (H) 01/06/2024   GFRNONAA >60 01/06/2024   GFRAA 132 03/06/2019   CALCIUM 8.2 (L) 01/06/2024   PHOS 3.9 08/12/2018   PROT 6.9 01/04/2024   ALBUMIN 4.2 01/04/2024   BILITOT 0.9 01/04/2024   ALKPHOS 43 01/04/2024   AST 27 01/04/2024   ALT 12 01/04/2024   ANIONGAP 9 01/06/2024    GFR: Estimated Creatinine Clearance: 98.6 mL/min (by C-G formula based on SCr of 0.88 mg/dL).  Recent Results (from the past 240 hours)  Wet prep, genital     Status: None   Collection Time: 12/28/23  3:44 PM   Specimen: PATH Cytology Cervicovaginal Ancillary Only  Result Value Ref Range Status   Yeast Wet Prep HPF POC NONE SEEN NONE SEEN Final   Trich, Wet Prep NONE SEEN NONE SEEN Final   Clue Cells Wet Prep HPF POC NONE SEEN NONE SEEN Final   WBC, Wet  Prep HPF POC <10 <10 Final   Sperm NONE SEEN  Final    Comment: Performed at [facility], [address], [place], [place] [address]  Blood Culture (routine x 2)     Status: None (Preliminary result)   Collection Time: 01/04/24  9:26 PM   Specimen: BLOOD RIGHT ARM  Result Value Ref Range Status   Specimen Description   Final    BLOOD RIGHT ARM Performed at [facility], [address], [place], [place] [address]    Special Requests   Final    BOTTLES DRAWN AEROBIC AND ANAEROBIC Blood Culture  results may not be optimal due to an inadequate volume of blood received in culture bottles Performed at [facility], [address], [place], [place] [address]    Culture  Setup Time   Final    GRAM POSITIVE RODS ANAEROBIC BOTTLE ONLY CRITICAL RESULT CALLED TO, READ BACK BY AND VERIFIED WITH: J [facility]  01/07/24 MK Performed at [facility], [address], [place], [place] [address]    Culture GRAM POSITIVE RODS  Final   Report Status PENDING  Incomplete  Resp panel by RT-PCR (RSV, Flu A&B, Covid) Anterior Nasal Swab     Status: None   Collection Time: 01/04/24 10:11 PM   Specimen: Anterior Nasal Swab  Result Value Ref Range Status   SARS Coronavirus 2 by RT PCR NEGATIVE NEGATIVE Final    Comment: (NOTE) SARS-CoV-2 target nucleic acids are NOT DETECTED.  The SARS-CoV-2 RNA is generally detectable in upper respiratory specimens during the acute phase of infection. The lowest concentration of SARS-CoV-2 viral copies this assay can detect is 138 copies/mL. A negative result does not preclude SARS-Cov-2 infection and should not be used as the sole basis for treatment or other patient management decisions. A negative result may occur with  improper specimen collection/handling, submission of specimen other than nasopharyngeal swab, presence of viral mutation(s) within the areas targeted by this assay, and inadequate number of viral copies(<138 copies/mL). A negative result must be combined with clinical observations, patient history, and epidemiological information. The expected result is Negative.  Fact Sheet for Patients:  BloggerCourse.com  Fact Sheet for Healthcare Providers:  SeriousBroker.it  This test is no t yet approved or cleared by the [place]  FDA and  has been authorized for detection and/or diagnosis of SARS-CoV-2 by FDA under an Emergency Use Authorization (EUA). This EUA  will remain  in effect (meaning this test can be used) for the duration of the COVID-19 declaration under Section 564(b)(1) of the Act, 21 U.S.C.section 360bbb-3(b)(1), unless the authorization is terminated  or revoked sooner.       Influenza A by PCR NEGATIVE NEGATIVE Final   Influenza B by PCR NEGATIVE NEGATIVE Final    Comment: (NOTE) The Xpert Xpress SARS-CoV-2/FLU/RSV plus assay is intended as an aid in the diagnosis of influenza from Nasopharyngeal swab specimens and should not be used as a sole basis for treatment. Nasal washings and aspirates are unacceptable for Xpert Xpress SARS-CoV-2/FLU/RSV testing.  Fact Sheet for Patients: BloggerCourse.com  Fact Sheet for Healthcare Providers: SeriousBroker.it  This test is not yet approved or cleared by the [place]  FDA and has been authorized for detection and/or diagnosis of SARS-CoV-2 by FDA under an Emergency Use Authorization (EUA). This EUA will remain in effect (meaning this test can be used) for the duration of the COVID-19 declaration under Section 564(b)(1) of the Act, 21 U.S.C. section 360bbb-3(b)(1), unless  the authorization is terminated or revoked.     Resp Syncytial Virus by PCR NEGATIVE NEGATIVE Final    Comment: (NOTE) Fact Sheet for Patients: BloggerCourse.com  Fact Sheet for Healthcare Providers: SeriousBroker.it  This test is not yet approved or cleared by the [place]  FDA and has been authorized for detection and/or diagnosis of SARS-CoV-2 by FDA under an Emergency Use Authorization (EUA). This EUA will remain in effect (meaning this test can be used) for the duration of the COVID-19 declaration under Section 564(b)(1) of the Act, 21 U.S.C. section 360bbb-3(b)(1), unless the authorization is terminated or revoked.  Performed at [facility], [address] [place], [place] [address]   Urine Culture     Status: Abnormal   Collection Time: 01/05/24 12:03 AM   Specimen: Urine, Clean Catch  Result Value Ref Range Status   Specimen Description   Final    URINE, CLEAN CATCH Performed at [facility], [address] [place], [place] [address]    Special Requests   Final    NONE Performed at [facility], [address], [place], [place] [address]    Culture MULTIPLE SPECIES PRESENT, SUGGEST RECOLLECTION (A)  Final   Report Status 01/06/2024 FINAL  Final      Radiology Studies: No results found.     LOS: 1 day    Aneita Keens, MD [facility] 01/07/2024, 11:54 AM   If 7PM-7AM, please contact night-coverage [email or web address]

## 2024-01-07 NOTE — Progress Notes (Signed)
 PHARMACY - PHYSICIAN COMMUNICATION CRITICAL VALUE ALERT - BLOOD CULTURE IDENTIFICATION (BCID)  Tracy Stout is an 28 y.o. female who presented to [facility] on 01/04/2024 with a chief complaint of worsening abdominal pain in setting of known hemorrhagic ovarian cyst with hemoperitoneum.  Assessment:  1/2 blood cultures with Gram positive rods  Name of physician (or Provider) Contacted: Dr. [facility] Lavender   Current antibiotics: Zosyn 3.375gm IV every 8 hours + doxycycline 100mg  IV BID for PID   Changes to prescribed antibiotics recommended:   -Continue current regimen at this time   Results for orders placed or performed during the hospital encounter of 06/22/23  Blood Culture ID Panel (Reflexed) (Collected: 06/22/2023  2:47 PM)  Result Value Ref Range   Enterococcus faecalis NOT DETECTED NOT DETECTED   Enterococcus Faecium NOT DETECTED NOT DETECTED   Listeria monocytogenes NOT DETECTED NOT DETECTED   Staphylococcus species DETECTED (A) NOT DETECTED   Staphylococcus aureus (BCID) NOT DETECTED NOT DETECTED   Staphylococcus epidermidis NOT DETECTED NOT DETECTED   Staphylococcus lugdunensis NOT DETECTED NOT DETECTED   Streptococcus species NOT DETECTED NOT DETECTED   Streptococcus agalactiae NOT DETECTED NOT DETECTED   Streptococcus pneumoniae NOT DETECTED NOT DETECTED   Streptococcus pyogenes NOT DETECTED NOT DETECTED   A.calcoaceticus-baumannii NOT DETECTED NOT DETECTED   Bacteroides fragilis NOT DETECTED NOT DETECTED   Enterobacterales NOT DETECTED NOT DETECTED   Enterobacter cloacae complex NOT DETECTED NOT DETECTED   Escherichia coli NOT DETECTED NOT DETECTED   Klebsiella aerogenes NOT DETECTED NOT DETECTED   Klebsiella oxytoca NOT DETECTED NOT DETECTED   Klebsiella pneumoniae NOT DETECTED NOT DETECTED   Proteus species NOT DETECTED NOT DETECTED   Salmonella species NOT DETECTED NOT DETECTED   Serratia marcescens NOT DETECTED NOT DETECTED   Haemophilus influenzae NOT DETECTED NOT  DETECTED   Neisseria meningitidis NOT DETECTED NOT DETECTED   Pseudomonas aeruginosa NOT DETECTED NOT DETECTED   Stenotrophomonas maltophilia NOT DETECTED NOT DETECTED   Candida albicans NOT DETECTED NOT DETECTED   Candida auris NOT DETECTED NOT DETECTED   Candida glabrata NOT DETECTED NOT DETECTED   Candida krusei NOT DETECTED NOT DETECTED   Candida parapsilosis NOT DETECTED NOT DETECTED   Candida tropicalis NOT DETECTED NOT DETECTED   Cryptococcus neoformans/gattii NOT DETECTED NOT DETECTED    Young Hensen, PharmD. Clinical Pharmacist 01/07/2024 3:54 AM

## 2024-01-07 NOTE — Progress Notes (Signed)
 Subjective: Patient reports tolerating PO.  Less abdominal pain no fever  Objective: I have reviewed patient's vital signs, labs, microbiology, and radiology results. Blood pressure 97/62, pulse 74, temperature 98.3 F (36.8 C), temperature source Oral, resp. rate 18, height 5\' 8"  (1.727 m), weight 73.5 kg, last menstrual period 11/13/2023, SpO2 98%.  General: alert, cooperative, and no distress GI: mild lower quadrant tenderness   Assessment/Plan: Responding to current antibiotic at 48 hr  LOS: 1 day    Onnie Bilis, MD 01/07/2024, 10:03 AM

## 2024-01-08 ENCOUNTER — Encounter: Payer: Self-pay | Admitting: Obstetrics & Gynecology

## 2024-01-08 ENCOUNTER — Other Ambulatory Visit (HOSPITAL_COMMUNITY): Payer: Self-pay

## 2024-01-08 DIAGNOSIS — Z87898 Personal history of other specified conditions: Secondary | ICD-10-CM

## 2024-01-08 DIAGNOSIS — R7881 Bacteremia: Secondary | ICD-10-CM

## 2024-01-08 DIAGNOSIS — N735 Female pelvic peritonitis, unspecified: Secondary | ICD-10-CM | POA: Diagnosis not present

## 2024-01-08 DIAGNOSIS — B182 Chronic viral hepatitis C: Secondary | ICD-10-CM

## 2024-01-08 LAB — PATHOLOGIST SMEAR REVIEW

## 2024-01-08 MED ORDER — DOXYCYCLINE HYCLATE 100 MG PO TABS: 100.0000 mg | ORAL_TABLET | Freq: Two times a day (BID) | ORAL | Status: DC

## 2024-01-08 MED ORDER — OXYCODONE-ACETAMINOPHEN 5-325 MG PO TABS
1.0000 | ORAL_TABLET | Freq: Four times a day (QID) | ORAL | 0 refills | Status: AC | PRN
  Filled 2024-01-08: qty 20, 5d supply, fill #0

## 2024-01-08 MED ORDER — DOXYCYCLINE HYCLATE 100 MG PO TABS
100.0000 mg | ORAL_TABLET | Freq: Two times a day (BID) | ORAL | 0 refills | Status: AC
  Filled 2024-01-08: qty 21, 11d supply, fill #0

## 2024-01-08 MED ORDER — AMOXICILLIN-POT CLAVULANATE 875-125 MG PO TABS
1.0000 | ORAL_TABLET | Freq: Two times a day (BID) | ORAL | 0 refills | Status: DC
  Filled 2024-01-08: qty 21, 11d supply, fill #0

## 2024-01-08 MED ORDER — AMOXICILLIN-POT CLAVULANATE 875-125 MG PO TABS
1.0000 | ORAL_TABLET | Freq: Two times a day (BID) | ORAL | Status: DC
  Administered 2024-01-08: 1 via ORAL
  Filled 2024-01-08: qty 1

## 2024-01-08 NOTE — Plan of Care (Signed)
  Problem: Education: Goal: Knowledge of General Education information will improve Description: Including pain rating scale, medication(s)/side effects and non-pharmacologic comfort measures Outcome: Progressing   Problem: Health Behavior/Discharge Planning: Goal: Ability to manage health-related needs will improve Outcome: Progressing   Problem: Clinical Measurements: Goal: Ability to maintain clinical measurements within normal limits will improve Outcome: Progressing Goal: Will remain free from infection Outcome: Progressing Goal: Diagnostic test results will improve Outcome: Progressing Goal: Cardiovascular complication will be avoided Outcome: Progressing   Problem: Coping: Goal: Level of anxiety will decrease Outcome: Progressing   Problem: Elimination: Goal: Will not experience complications related to urinary retention Outcome: Progressing   Problem: Pain Managment: Goal: General experience of comfort will improve and/or be controlled Outcome: Progressing   Problem: Safety: Goal: Ability to remain free from injury will improve Outcome: Progressing

## 2024-01-08 NOTE — Progress Notes (Signed)
 Reviewed AVS, patient expressed understanding of medications, MD follow up reviewed.   Removed IV, Site clean, dry and intact.  Patient states all belongings brought to the hospital at time of admission are accounted for and packed to take home.  Picked up medications from [facility] pharmacy. Pt transported to Discharge lounge to wait for transportation home.

## 2024-01-08 NOTE — Progress Notes (Signed)
 Transition of Care [facility]) - Inpatient Brief Assessment   Patient Details  Name: Tracy Stout MRN: [id number] Date of Birth: 11-May-1996  Transition of Care [facility] CM/SW Contact:    Jannine Meo, RN Phone Number: 01/08/2024, 2:10 PM   Clinical Narrative: Patient from home with c/o abdominal pain, found to have PID. IV antibiotics changed to PO. Possible discharge home today.    Transition of Care Asessment: Insurance and Status: (P) Insurance coverage has been reviewed Patient has primary care physician: (P) Yes Home environment has been reviewed: (P) Home Prior level of function:: (P) Independent Prior/Current Home Services: (P) No current home services Social Drivers of Health Review: (P) SDOH reviewed no interventions necessary Readmission risk has been reviewed: (P) Yes Transition of care needs: (P) no transition of care needs at this time

## 2024-01-08 NOTE — Consult Note (Signed)
 Date of Admission:  01/04/2024          Reason for Consult: PID and GPR in isolated blood culture    Referring Provider: Jacquelin Hawking, MD   Assessment:  PID Hemorrhagic cyst GPR in blood -- likely a contaminant vs anerobe History of hepatitis C status post cure History intravenous drug use in remission  Plan:  Changed to Augmentin with doxycycline to complete 14 days of enteric coverage between Zosyn and Augmentin and 7 days of coverage for chlamydia with doxycycline  (her chlamydia test was negative on admission on the third) HCV RNA to clarify that she has been cured of her hepatitis C from her prior treatment with Mavyret If she is interested HIV preexposure prophylaxis would be happy to provide that at the [facility]  I think she can be DC to home today.  Principal Problem:   Peritonitis of abdominen and pelvis Active Problems:   Hemoperitoneum   Scheduled Meds:  amoxicillin-clavulanate  1 tablet Oral Q12H   doxycycline  100 mg Oral Q12H   pantoprazole  40 mg Oral Daily   sodium chloride flush  3 mL Intravenous Q12H   Continuous Infusions: PRN Meds:.HYDROmorphone (DILAUDID) injection, ibuprofen, oxyCODONE-acetaminophen  HPI: Makynna Manocchio is a 28 y.o. female who was recently admitted for what was thought to largely be a hemorrhagic ovarian cyst with overnight stay from the third to the fourth.  She then developed worsening abdominal pain 6 days later with fevers and was brought into the emergency department.  She had a blood culture done which is now growing a gram-positive rod.  Repeat CT abdomen pelvis showed residual moderate to large hemoperitoneum in the pelvis but without rim-enhancing fluid collection to suggest abscess and interval decrease in some hyperdense fluid around the liver.  Patient was treated with doxycycline and Zosyn as improved clinically.  Of note she had testing for gonorrhea chlamydia done on 3 April with  her initial admission that was negative.  I suspect that the gram-positive rod in the blood culture is likely a contaminant.  If is not a contaminant is more likely an anaerobe which I do not see the reason to cover with an IV antibiotic any further.  Think we can safely change her to oral doxycycline and Augmentin with a goal of completing 7 days of doxycycline and 14 days of antibiotics starting enteric flora between the Zosyn and the Augmentin  She has a history of hepatitis C that was treated with Mavyret and is believed to been cured.  She has a prior history of intravenous drug use with fentanyl but has been clean after having been incarcerated and been in a special program and removed herself from the people and places where she had been injecting drugs.  She tested negative for HIV on this admission as well as for syphilis.  I did discuss the idea of HIV preexposure prophylaxis to her.  I have personally spent 80 minutes involved in face-to-face and non-face-to-face activities for this patient on the day of the visit. Professional time spent includes the following activities: Preparing to see the patient (review of tests), Obtaining and/or reviewing separately obtained history (admission/discharge record), Performing a medically appropriate examination and/or evaluation , Ordering medications/tests/procedures, referring and communicating with other health care professionals, Documenting clinical information in the EMR, Independently interpreting results (not separately reported), Communicating results to the patient/family/caregiver, Counseling and educating the patient/family/caregiver and Care coordination (not separately reported).  Evaluation of the patient requires complex antimicrobial therapy evaluation, counseling , isolation needs to reduce disease transmission and risk assessment and mitigation.    Review of Systems: Review of Systems  Constitutional:  Negative for chills,  diaphoresis, fever, malaise/fatigue and weight loss.  HENT:  Negative for congestion, hearing loss, sore throat and tinnitus.   Eyes:  Negative for blurred vision and double vision.  Respiratory:  Negative for cough, sputum production, shortness of breath and wheezing.   Cardiovascular:  Negative for chest pain, palpitations and leg swelling.  Gastrointestinal:  Positive for abdominal pain. Negative for blood in stool, constipation, diarrhea, heartburn, melena, nausea and vomiting.  Genitourinary:  Negative for dysuria, flank pain and hematuria.  Musculoskeletal:  Negative for back pain, falls, joint pain and myalgias.  Skin:  Negative for itching and rash.  Neurological:  Negative for dizziness, sensory change, focal weakness, loss of consciousness, weakness and headaches.  Endo/Heme/Allergies:  Does not bruise/bleed easily.  Psychiatric/Behavioral:  Negative for depression, memory loss and suicidal ideas. The patient is not nervous/anxious.     Past Medical History:  Diagnosis Date   ADHD (attention deficit hyperactivity disorder)    Anxiety    Bipolar 1 disorder (HCC)    Borderline personality disorder (HCC)    Depression    History of pyelonephritis during pregnancy 10/06/2016   Admitted 10/06/16> progressed to pneumonia/ARDS> tx to [facility], on ventilator for few days, d/c'd 10/15/16       On Keflex suppression     Pneumonia    Polysubstance abuse (HCC)    PTSD (post-traumatic stress disorder)    Self-inflicted injury    Suicide attempt (HCC)     Social History   Tobacco Use   Smoking status: Every Day    Current packs/day: 1.00    Average packs/day: 1 pack/day for 3.0 years (3.0 ttl pk-yrs)    Types: Cigarettes   Smokeless tobacco: Never  Vaping Use   Vaping status: Never Used  Substance Use Topics   Alcohol use: Not Currently   Drug use: Yes    Types: Marijuana    Comment: hx opiate and marijuana use    Family History  Problem Relation Age of Onset   Hypertension  Paternal Grandmother    Bipolar disorder Paternal Grandmother    Depression Maternal Grandmother    Stroke Maternal Grandfather        X 5   Bipolar disorder Father    ADD / ADHD Father    Hypertension Father    Drug abuse Father        drug overdose   Depression Father    Depression Mother    ADD / ADHD Brother    Bipolar disorder Sister    Depression Sister    ADD / ADHD Sister    Other Sister        heart condition; had open heart surgery   No Known Allergies  OBJECTIVE: Blood pressure 106/63, pulse 66, temperature 98.1 F (36.7 C), temperature source Oral, resp. rate 18, height 5\' 8"  (1.727 m), weight 73.5 kg, last menstrual period 11/13/2023, SpO2 100%.  Physical Exam Constitutional:      General: She is not in acute distress.    Appearance: She is not diaphoretic.  HENT:     Head: Normocephalic and atraumatic.     Right Ear: External ear normal.     Left Ear: External ear normal.     Nose: Nose normal.     Mouth/Throat:  Pharynx: No oropharyngeal exudate.  Eyes:     General: No scleral icterus.       Right eye: No discharge.        Left eye: No discharge.     Extraocular Movements: Extraocular movements intact.     Conjunctiva/sclera: Conjunctivae normal.  Cardiovascular:     Rate and Rhythm: Normal rate and regular rhythm.  Pulmonary:     Effort: Pulmonary effort is normal. No respiratory distress.     Breath sounds: No wheezing.  Abdominal:     General: There is no distension.     Palpations: Abdomen is soft.  Musculoskeletal:        General: No tenderness. Normal range of motion.     Cervical back: Normal range of motion and neck supple.  Lymphadenopathy:     Cervical: No cervical adenopathy.  Skin:    General: Skin is warm and dry.     Coloration: Skin is not jaundiced or pale.     Findings: No erythema, lesion or rash.  Neurological:     General: No focal deficit present.     Mental Status: She is alert and oriented to person, place, and  time.     Coordination: Coordination normal.  Psychiatric:        Mood and Affect: Mood normal.        Behavior: Behavior normal.        Thought Content: Thought content normal.        Judgment: Judgment normal.     Lab Results Lab Results  Component Value Date   WBC 3.6 (L) 01/07/2024   HGB 9.6 (L) 01/07/2024   HCT 28.4 (L) 01/07/2024   MCV 96.6 01/07/2024   PLT 177 01/07/2024    Lab Results  Component Value Date   CREATININE 0.88 01/06/2024   BUN 9 01/06/2024   NA 136 01/06/2024   K 3.6 01/06/2024   CL 103 01/06/2024   CO2 24 01/06/2024    Lab Results  Component Value Date   ALT 12 01/04/2024   AST 27 01/04/2024   GGT 43 (H) 12/12/2018   ALKPHOS 43 01/04/2024   BILITOT 0.9 01/04/2024     Microbiology: Recent Results (from the past 240 hours)  Blood Culture (routine x 2)     Status: None (Preliminary result)   Collection Time: 01/04/24  9:26 PM   Specimen: BLOOD RIGHT ARM  Result Value Ref Range Status   Specimen Description   Final    BLOOD RIGHT ARM Performed at [facility], [address], [place], [place] [address]    Special Requests   Final    BOTTLES DRAWN AEROBIC AND ANAEROBIC Blood Culture results may not be optimal due to an inadequate volume of blood received in culture bottles Performed at [facility], [address], [place], [place] [address]    Culture  Setup Time   Final    GRAM POSITIVE RODS ANAEROBIC BOTTLE ONLY CRITICAL RESULT CALLED TO, READ BACK BY AND VERIFIED WITH: [facility]  01/07/24 MK    Culture   Final    GRAM POSITIVE RODS IDENTIFICATION TO FOLLOW Performed at [facility], [address], [place], [place] [address]    Report Status PENDING  Incomplete  Resp panel by RT-PCR (RSV, Flu A&B, Covid) Anterior Nasal Swab     Status: None   Collection Time: 01/04/24 10:11 PM   Specimen: Anterior Nasal Swab  Result Value Ref Range Status   SARS  Coronavirus 2 by RT PCR NEGATIVE NEGATIVE  Final    Comment: (NOTE) SARS-CoV-2 target nucleic acids are NOT DETECTED.  The SARS-CoV-2 RNA is generally detectable in upper respiratory specimens during the acute phase of infection. The lowest concentration of SARS-CoV-2 viral copies this assay can detect is 138 copies/mL. A negative result does not preclude SARS-Cov-2 infection and should not be used as the sole basis for treatment or other patient management decisions. A negative result may occur with  improper specimen collection/handling, submission of specimen other than nasopharyngeal swab, presence of viral mutation(s) within the areas targeted by this assay, and inadequate number of viral copies(<138 copies/mL). A negative result must be combined with clinical observations, patient history, and epidemiological information. The expected result is Negative.  Fact Sheet for Patients:  BloggerCourse.com  Fact Sheet for Healthcare Providers:  SeriousBroker.it  This test is no t yet approved or cleared by the [place] FDA and  has been authorized for detection and/or diagnosis of SARS-CoV-2 by FDA under an Emergency Use Authorization (EUA). This EUA will remain  in effect (meaning this test can be used) for the duration of the COVID-19 declaration under Section 564(b)(1) of the Act, 21 U.S.C.section 360bbb-3(b)(1), unless the authorization is terminated  or revoked sooner.       Influenza A by PCR NEGATIVE NEGATIVE Final   Influenza B by PCR NEGATIVE NEGATIVE Final    Comment: (NOTE) The Xpert Xpress SARS-CoV-2/FLU/RSV plus assay is intended as an aid in the diagnosis of influenza from Nasopharyngeal swab specimens and should not be used as a sole basis for treatment. Nasal washings and aspirates are unacceptable for Xpert Xpress SARS-CoV-2/FLU/RSV testing.  Fact Sheet for Patients: BloggerCourse.com  Fact Sheet for Healthcare  Providers: SeriousBroker.it  This test is not yet approved or cleared by the [place] FDA and has been authorized for detection and/or diagnosis of SARS-CoV-2 by FDA under an Emergency Use Authorization (EUA). This EUA will remain in effect (meaning this test can be used) for the duration of the COVID-19 declaration under Section 564(b)(1) of the Act, 21 U.S.C. section 360bbb-3(b)(1), unless the authorization is terminated or revoked.     Resp Syncytial Virus by PCR NEGATIVE NEGATIVE Final    Comment: (NOTE) Fact Sheet for Patients: BloggerCourse.com  Fact Sheet for Healthcare Providers: SeriousBroker.it  This test is not yet approved or cleared by the [place] FDA and has been authorized for detection and/or diagnosis of SARS-CoV-2 by FDA under an Emergency Use Authorization (EUA). This EUA will remain in effect (meaning this test can be used) for the duration of the COVID-19 declaration under Section 564(b)(1) of the Act, 21 U.S.C. section 360bbb-3(b)(1), unless the authorization is terminated or revoked.  Performed at [facility], [address] [place], [place] [address]   Urine Culture     Status: Abnormal   Collection Time: 01/05/24 12:03 AM   Specimen: Urine, Clean Catch  Result Value Ref Range Status   Specimen Description   Final    URINE, CLEAN CATCH Performed at [facility], [address] [place], [place] [address]    Special Requests   Final    NONE Performed at [facility], [address], [place], [place] [address]    Culture MULTIPLE SPECIES PRESENT, SUGGEST RECOLLECTION (A)  Final   Report Status 01/06/2024 FINAL  Final    Acey Lav, MD [facility] [phone] pager  01/08/2024, 1:44 PM

## 2024-01-08 NOTE — Progress Notes (Signed)
    Faculty Practice OB/GYN Attending Note  Subjective:  Doing better today, pain improved since admission.  Rates her pain 6/10.   Denies fever/chills.  Denies nausea/vomiting, tolerating regular diet.  Ambulating without difficulty.  No acute complaints.    Objective:  Blood pressure 106/63, pulse 66, temperature 98.1 F (36.7 C), temperature source Oral, resp. rate 18, height 5\' 8"  (1.727 m), weight 73.5 kg, last menstrual period 11/13/2023, SpO2 100%. Afebrile since admission Gen: NAD HENT: Normocephalic, atraumatic Lungs: Normal respiratory effort, CTAB Heart: RRR Abdomen: soft, no rebound, no guarding, appropriately tender Ext: 2+ DTRs, no edema, no cyanosis, negative Homan's sign  Assessment & Plan:  28 y.o. G1P1001 admitted for peritonitis  -responding appropriately to IV antibiotics, would transition to oral to completed 14 day course -pain well controlled -afebrile, VS, CBC stable  Pt may follow up at [facility] in ~ 2wks- note sent to office to schedule  Appreciate management per primary care team, suspect possible discharge home today  Pamala Hayman, DO Attending Obstetrician & Gynecologist, [facility] for [name], [facility]

## 2024-01-08 NOTE — Discharge Instructions (Addendum)
 Tracy Stout,  You were in the hospital with an infection of your pelvis area. This was managed with antibiotics with improvement. Please follow-up with the OB/Gynecologist. Please continue your antibiotics as prescribed.

## 2024-01-09 LAB — CULTURE, BLOOD (ROUTINE X 2): Culture  Setup Time: NO GROWTH

## 2024-01-09 LAB — HCV RNA QUANT: HCV Quantitative: NOT DETECTED [IU]/mL (ref >50)

## 2024-01-11 ENCOUNTER — Encounter: Payer: Self-pay | Admitting: Obstetrics & Gynecology

## 2024-01-11 ENCOUNTER — Telehealth: Payer: Self-pay

## 2024-01-11 NOTE — Telephone Encounter (Signed)
-----   Message from Deberah [place] sent at 01/10/2024  6:13 PM EDT ----- Pts hCV is negative so her hep c was cured. Her blood culture organism would be covered by the antibiotics we sent her home with IF it was even a pathogen (may have been a contaiminant) ----- Message ----- From: Dannis Dy, [facility] [place] Sent: 01/09/2024   4:37 PM EDT To: Charolette Copier, MD

## 2024-01-15 ENCOUNTER — Emergency Department (HOSPITAL_BASED_OUTPATIENT_CLINIC_OR_DEPARTMENT_OTHER)

## 2024-01-15 ENCOUNTER — Other Ambulatory Visit: Payer: Self-pay

## 2024-01-15 ENCOUNTER — Encounter (HOSPITAL_BASED_OUTPATIENT_CLINIC_OR_DEPARTMENT_OTHER): Payer: Self-pay | Admitting: Emergency Medicine

## 2024-01-15 ENCOUNTER — Emergency Department (HOSPITAL_BASED_OUTPATIENT_CLINIC_OR_DEPARTMENT_OTHER): Admission: EM | Admit: 2024-01-15 | Discharge: 2024-01-15 | Disposition: A | Discharge status: Home / Self Care

## 2024-01-15 DIAGNOSIS — N83202 Unspecified ovarian cyst, left side: Secondary | ICD-10-CM | POA: Diagnosis not present

## 2024-01-15 DIAGNOSIS — R102 Pelvic and perineal pain: Secondary | ICD-10-CM

## 2024-01-15 DIAGNOSIS — N83209 Unspecified ovarian cyst, unspecified side: Secondary | ICD-10-CM

## 2024-01-15 LAB — URINALYSIS, W/ REFLEX TO CULTURE (INFECTION SUSPECTED)
Bacteria, UA: NONE SEEN
Bilirubin Urine: NEGATIVE
Glucose, UA: NEGATIVE mg/dL
Hgb urine dipstick: NEGATIVE
Ketones, ur: NEGATIVE mg/dL
Nitrite: NEGATIVE
Protein, ur: NEGATIVE mg/dL
Specific Gravity, Urine: 1.018 (ref 1.005–1.030)
pH: 7 (ref 5.0–8.0)

## 2024-01-15 LAB — COMPREHENSIVE METABOLIC PANEL WITH GFR
ALT: 18 U/L (ref 0–44)
AST: 22 U/L (ref 15–41)
Albumin: 4.6 g/dL (ref 3.5–5.0)
Alkaline Phosphatase: 57 U/L (ref 38–126)
Anion gap: 7 (ref 5–15)
BUN: 11 mg/dL (ref 6–20)
CO2: 28 mmol/L (ref 22–32)
Calcium: 9.7 mg/dL (ref 8.9–10.3)
Chloride: 102 mmol/L (ref 98–111)
Creatinine, Ser: 0.77 mg/dL (ref 0.44–1.00)
GFR, Estimated: >60 mL/min (ref >60)
Glucose, Bld: 100 mg/dL — ABNORMAL HIGH (ref 70–99)
Potassium: 3.7 mmol/L (ref 3.5–5.1)
Sodium: 137 mmol/L (ref 135–145)
Total Bilirubin: 0.6 mg/dL (ref 0.0–1.2)
Total Protein: 7.5 g/dL (ref 6.5–8.1)

## 2024-01-15 LAB — CBC WITH DIFFERENTIAL/PLATELET
Abs Immature Granulocytes: 0.03 10*3/uL (ref 0.00–0.07)
Basophils Absolute: 0.1 10*3/uL (ref 0.0–0.1)
Basophils Relative: 1 %
Eosinophils Absolute: 0 10*3/uL (ref 0.0–0.5)
Eosinophils Relative: 1 %
HCT: 31.4 % — ABNORMAL LOW (ref 36.0–46.0)
Hemoglobin: 10.6 g/dL — ABNORMAL LOW (ref 12.0–15.0)
Immature Granulocytes: 1 %
Lymphocytes Relative: 25 %
Lymphs Abs: 1.3 10*3/uL (ref 0.7–4.0)
MCH: 31.9 pg (ref 26.0–34.0)
MCHC: 33.8 g/dL (ref 30.0–36.0)
MCV: 94.6 fL (ref 80.0–100.0)
Monocytes Absolute: 0.3 10*3/uL (ref 0.1–1.0)
Monocytes Relative: 6 %
Neutro Abs: 3.4 10*3/uL (ref 1.7–7.7)
Neutrophils Relative %: 66 %
Platelets: 217 10*3/uL (ref 150–400)
RBC: 3.32 MIL/uL — ABNORMAL LOW (ref 3.87–5.11)
RDW: 12.8 % (ref 11.5–15.5)
WBC: 5.1 10*3/uL (ref 4.0–10.5)
nRBC: 0 % (ref 0.0–0.2)

## 2024-01-15 LAB — PREGNANCY, URINE: Preg Test, Ur: NEGATIVE

## 2024-01-15 LAB — LACTIC ACID, PLASMA: Lactic Acid, Venous: 1.3 mmol/L (ref 0.5–1.9)

## 2024-01-15 MED ORDER — MORPHINE SULFATE (PF) 2 MG/ML IV SOLN
2.0000 mg | Freq: Once | INTRAVENOUS | Status: AC
  Administered 2024-01-15: 2 mg via INTRAVENOUS
  Filled 2024-01-15: qty 1

## 2024-01-15 MED ORDER — ACETAMINOPHEN 325 MG PO TABS
975.0000 mg | ORAL_TABLET | Freq: Once | ORAL | Status: AC
  Administered 2024-01-15: 975 mg via ORAL
  Filled 2024-01-15: qty 3

## 2024-01-15 MED ORDER — IBUPROFEN 600 MG PO TABS: 600.0000 mg | ORAL_TABLET | Freq: Four times a day (QID) | ORAL | 0 refills | Status: DC | PRN

## 2024-01-15 MED ORDER — OXYCODONE-ACETAMINOPHEN 5-325 MG PO TABS
1.0000 | ORAL_TABLET | Freq: Once | ORAL | Status: AC
  Administered 2024-01-15: 1 via ORAL
  Filled 2024-01-15: qty 1

## 2024-01-15 MED ORDER — CEPHALEXIN 500 MG PO CAPS
500.0000 mg | ORAL_CAPSULE | Freq: Four times a day (QID) | ORAL | 0 refills | Status: AC
Start: 2024-01-15 — End: 2024-01-22

## 2024-01-15 NOTE — ED Notes (Signed)
 Pt aware of need for urine sample, unable to provide at this time.

## 2024-01-15 NOTE — ED Provider Notes (Signed)
 Tracy Stout EMERGENCY DEPARTMENT AT [facility] Provider Note   CSN: [id number] Arrival date & time: 01/15/24  1213     History  Chief Complaint  Patient presents with   Pelvic Pain    Tracy Stout is a 28 y.o. female who was recently admitted for presumed bacterial peritonitis.  Patient was discharged on 4/14 on Augmentin  and doxycycline .  Patient stopped taking the Augmentin  on Saturday (4/19) because she felt like she was having a reaction to it.  [name] pelvic pain returned yesterday.  Patient endorses pain.  Patient denies fever, chills, urinary symptoms, nausea, vomiting, diarrhea, vaginal discharge, chest pain, or shortness of breath.   Pelvic Pain       Home Medications Prior to Admission medications   Medication Sig Start Date End Date Taking? Authorizing Provider  amoxicillin -clavulanate (AUGMENTIN ) 875-125 MG tablet Take 1 tablet by mouth every 12 (twelve) hours for 11 days. 01/08/24 01/19/24  Verlyn Goad, MD  doxycycline  (VIBRA -TABS) 100 MG tablet Take 1 tablet (100 mg total) by mouth 2 (two) times daily for 11 days. 01/08/24 01/19/24  Verlyn Goad, MD  norgestimate -ethinyl estradiol  (ORTHO-CYCLEN) 0.25-35 MG-MCG tablet Take 1 tablet by mouth daily. 12/29/23   Raynell Caller, MD  polyethylene glycol powder (GLYCOLAX /MIRALAX ) 17 GM/SCOOP powder Take 17 g by mouth daily. Patient not taking: Reported on 01/05/2024 12/29/23   Raynell Caller, MD  simethicone  [facility]) 80 MG chewable tablet Chew 1 tablet (80 mg total) by mouth 4 (four) times daily as needed for flatulence (gas). Patient not taking: Reported on 01/05/2024 12/29/23   Raynell Caller, MD      Allergies    Patient has no known allergies.    Review of Systems   Review of Systems  Genitourinary:  Positive for pelvic pain.    Physical Exam Updated Vital Signs BP 130/85 (BP Location: Right Arm)   Pulse (!) 105   Temp 97.8 F (36.6 C)   Resp 16   LMP 11/13/2023 (Exact Date)   SpO2 99%   Physical Exam Vitals and nursing note reviewed.  Constitutional:      General: She is not in acute distress.    Appearance: She is well-developed. She is ill-appearing. She is not toxic-appearing or diaphoretic.  HENT:     Head: Normocephalic and atraumatic.     Right Ear: External ear normal.     Left Ear: External ear normal.     Nose: Nose normal.     Mouth/Throat:     Mouth: Mucous membranes are moist.  Eyes:     Extraocular Movements: Extraocular movements intact.     Conjunctiva/sclera: Conjunctivae normal.  Cardiovascular:     Rate and Rhythm: Normal rate and regular rhythm.     Pulses: Normal pulses.     Heart sounds: Normal heart sounds. No murmur heard. Pulmonary:     Effort: Pulmonary effort is normal. No respiratory distress.     Breath sounds: Normal breath sounds.  Abdominal:     Palpations: Abdomen is soft.     Tenderness: There is abdominal tenderness in the suprapubic area. Negative signs include Murphy's sign, Rovsing's sign and McBurney's sign.  Musculoskeletal:        General: No swelling.     Cervical back: Normal range of motion and neck supple.  Skin:    General: Skin is warm and dry.     Capillary Refill: Capillary refill takes less than 2 seconds.  Neurological:     General: No focal deficit present.  Mental Status: She is alert.  Psychiatric:        Mood and Affect: Mood normal.     ED Results / Procedures / Treatments   Labs (all labs ordered are listed, but only abnormal results are displayed) Labs Reviewed - No data to display  EKG None  Radiology No results found.  Procedures Procedures    Medications Ordered in ED Medications - No data to display  ED Course/ Medical Decision Making/ A&P                                 Medical Decision Making Amount and/or Complexity of Data Reviewed Labs: ordered. Radiology: ordered.  Risk OTC drugs. Prescription drug management.   This patient presents to the ED for concern of  pelvic pain, this involves an extensive number of treatment options, and is a complaint that carries with it a high risk of complications and morbidity.  The differential diagnosis includes bacterial peritonitis, constipation, SBO, bowel perforation, cystitis, pelvic abscess   Co morbidities that complicate the patient evaluation  Anxiety, peritonitis, hemorrhagic ovarian cyst   Additional history obtained:  External records from outside source obtained and reviewed including previous admissions   Lab Tests:  I Ordered, and personally interpreted labs.  The pertinent results include: Lactic 1.3, CMP WNL, anemia at 10.6 which is improved from 9.6 on 4/13, UA with trace leukocytes   Imaging Studies ordered:  I ordered imaging studies including ultrasound pelvic transabdominal with pelvic Doppler I independently visualized and interpreted imaging which showed Pending I agree with the radiologist interpretation   Cardiac Monitoring: / EKG:  The patient was maintained on a cardiac monitor.  I personally viewed and interpreted the cardiac monitored which showed an underlying rhythm of: Sinus rhythm   Problem List / ED Course / Critical interventions / Medication management  I ordered medication including Percocet and morphine  for pain Reevaluation of the patient after these medicines showed that the patient improved I have reviewed the patients home medicines and have made adjustments as needed   Patient signed out to Paris Bolds, PA-C pending imaging results and likely discharge.        Final Clinical Impression(s) / ED Diagnoses Final diagnoses:  None    Rx / DC Orders ED Discharge Orders     None         Carie Charity, PA-C 01/16/24 1610    Rolinda Climes, DO 01/16/24 1026

## 2024-01-15 NOTE — Telephone Encounter (Signed)
 Spoke with Sherline Distel, let her know that the doxycycline  is less important than the Augmentin  per Dr. Ernie Heal. Discussed his concerns that abdominal infection could have recurred.   She is about to leave work to go to [facility] ED for further evaluation.   Keiland Pickering, BSN, RN

## 2024-01-15 NOTE — ED Triage Notes (Signed)
 C/o pelvic pain x 2 weeks. Recently admitted for bacterial peritonitis. States pain returned this week and was concerned for recurrent infection. Denies fevers.

## 2024-01-15 NOTE — Discharge Instructions (Addendum)
 Thank you for letting us  evaluate you today.  Your lab work all is improved.  Please stop taking Augmentin , doxy and I have sent you a new antibiotic Keflex  to your pharmacy to take.  Please make sure you take entire prescription.  Do not miss a dose even if you are feeling better. Follow up with GYN as scheduled.  Return to Emergency Department if you experience significant worsening symptoms, fever

## 2024-01-15 NOTE — ED Provider Notes (Signed)
 Received patient at signout from previous provider pending pelvic ultrasound, dispo.  See her note.  In short, patient presents emergency department for evaluation of pelvic pain that started yesterday.  She was recently discharged on 4/14 for Augmentin  and Doxy for hemorrhagic cyst with hemoperitoneum.  She endorses that she stopped taking it as she felt like she was having a reaction to it.  ED workup notable for improved hemoglobin of 10.6 from 9.68 days ago.  No leukocytosis.  Lactic acid WNL.  CMP unremarkable.  UA unremarkable  Provided Percocet, morphine , Tylenol  for patient's pain here in emergency department and upon reassessment appears very well comfortable sleeping in bed.  Pelvic ultrasound notable for new cystic structure in the left adnexa new compared to prior study on 12/28/2023 which could represent abscess or hematoma.   Consulted GYN Dr. Racheal Buddle who reviewed ED workup, imaging. He is reassured that patient is hemodynamically stable with no fever, tachycardia, nor leukocytosis. Structure found on imaging likely a hematoma. He recommended analgesia and completion of antibiotics.  Following consult, patient reports that she stopped taking Doxy, Augmentin  as it started to make her itch and experience some shortness of breath.  Discussed this with my attending Dr. Florie Husband who recommended Keflex  as an alternative  Discussed ED workup, disposition, return to ED precautions with patient who expresses understanding agrees with plan.  All questions answered to their satisfaction.  They are agreeable to plan.  Discharge instructions provided on paperwork  Dr. Florie Husband individually assessed patient and agrees with plan   Royann Cords, PA 01/15/24 2322    Afton Horse T, DO 01/20/24 1549

## 2024-01-15 NOTE — Telephone Encounter (Signed)
 Called and spoke with Tracy Stout, she reports she still has a rash (picture sent to her OBGYN in separate message thread). Endorses shortness of breath, even while resting. Denies tongue or lip swelling or throat tightness. She does not sound as if she is in respiratory distress on the phone.  Endorses lower pelvic pain that started up again yesterday. She stopped taking the Augmentin  on Saturday, but is still taking the doxycycline .   Encouraged her to go to ED for further evaluation due to shortness of breath and rash. She verbalized understanding. She would like to know if Dr. Ernie Heal has any ideas as to why her pelvic pain is recurring.   [name], BSN, RN

## 2024-01-15 NOTE — Telephone Encounter (Signed)
 Tracy Stout called back in tears  and currently at [facility]. She is stating she does not feel that they will repeat the CT scan for her.  Do you want to place an order for her or reach out to the NP seeing the patient?

## 2024-01-17 ENCOUNTER — Ambulatory Visit: Payer: MEDICAID | Admitting: Obstetrics & Gynecology

## 2024-01-17 ENCOUNTER — Encounter: Payer: Self-pay | Admitting: Obstetrics & Gynecology

## 2024-01-17 ENCOUNTER — Other Ambulatory Visit (HOSPITAL_COMMUNITY)
Admission: RE | Admit: 2024-01-17 | Discharge: 2024-01-17 | Disposition: A | Discharge status: Home / Self Care | Source: Ambulatory Visit | Attending: Obstetrics & Gynecology | Admitting: Obstetrics & Gynecology

## 2024-01-17 VITALS — BP 101/61 | HR 85 | Ht 68.0 in | Wt 159.6 lb

## 2024-01-17 DIAGNOSIS — N730 Acute parametritis and pelvic cellulitis: Secondary | ICD-10-CM | POA: Diagnosis not present

## 2024-01-17 DIAGNOSIS — Z01419 Encounter for gynecological examination (general) (routine) without abnormal findings: Secondary | ICD-10-CM

## 2024-01-17 DIAGNOSIS — N9089 Other specified noninflammatory disorders of vulva and perineum: Secondary | ICD-10-CM | POA: Diagnosis not present

## 2024-01-17 DIAGNOSIS — A609 Anogenital herpesviral infection, unspecified: Secondary | ICD-10-CM

## 2024-01-17 DIAGNOSIS — Z30016 Encounter for initial prescription of transdermal patch hormonal contraceptive device: Secondary | ICD-10-CM

## 2024-01-17 MED ORDER — VALACYCLOVIR HCL 500 MG PO TABS: 500.0000 mg | ORAL_TABLET | Freq: Two times a day (BID) | ORAL | 3 refills | Status: AC

## 2024-01-17 MED ORDER — NORELGESTROMIN-ETH ESTRADIOL 150-35 MCG/24HR TD PTWK: 1.0000 | MEDICATED_PATCH | TRANSDERMAL | 4 refills | Status: DC

## 2024-01-17 NOTE — Progress Notes (Signed)
 GYN VISIT Patient name: Tracy Stout MRN [id number]  Date of birth: 07-18-1996 Chief Complaint:   Gynecologic Exam  History of Present Illness:   Tracy Stout is a 28 y.o. G2P1001 female being seen today for annual exam and following concerns:.     1) Recent hospitalization for PID: -Transition from Doxy/Augmetin to Keflex  (on Monday) due to allergic reaction -Still having some mild pelvic plain, but improved from previously - Denies fevers or chills  2) Vulvar lesion: -Patient reports that she sees a sore on the outside -While the story is disjointed, it sounds as though she may have been previously diagnosed with HSV and thinks this may be the same thing.  She reports that the last time something happened was many years ago - Sexually active with the same partner for the past 3 months - She has also noted some vaginal itching.  Denies odor  3) Contraceptive management - She does have the pill at home, but notes issues with compliance and is interested in reviewing her options - She does not desire pregnancy at this time  [name] last menstrual period was 11/13/2023 (exact date).    Review of Systems:   Pertinent items are noted in HPI Denies fever/chills, dizziness, headaches, visual disturbances, fatigue, shortness of breath, chest pain, abdominal pain, vomiting, no problems with periods, bowel movements, urination, or intercourse unless otherwise stated above.  Pertinent History Reviewed:   Past Surgical History:  Procedure Laterality Date   tubes in ears      Past Medical History:  Diagnosis Date   ADHD (attention deficit hyperactivity disorder)    Anxiety    Bipolar 1 disorder (HCC)    Borderline personality disorder (HCC)    Depression    History of pyelonephritis during pregnancy 10/06/2016   Admitted 10/06/16> progressed to pneumonia/ARDS> tx to [facility], on ventilator for few days, d/c'd 10/15/16       On Keflex  suppression     Pneumonia    Polysubstance abuse  (HCC)    PTSD (post-traumatic stress disorder)    Self-inflicted injury    Suicide attempt [facility])    Reviewed problem list, medications and allergies. Physical Assessment:   Vitals:   01/17/24 1412  BP: 101/61  Pulse: 85  Weight: 159 lb 9.6 oz (72.4 kg)  Height: 5\' 8"  (1.727 m)  Body mass index is 24.27 kg/m.       Physical Examination:   General appearance: alert, well appearing, and in no distress  Psych: mood appropriate, normal affect  Skin: warm & dry   Cardiovascular: normal heart rate noted  Respiratory: normal respiratory effort, no distress  Abdomen: soft, non-tender   Pelvic: VULVA: Midportion of right labia with small cluster of blisters that were painful to palpation.  No other abnormalities noted VAGINA: normal appearing vagina with normal color, white discharge, no lesions, CERVIX: normal appearing cervix without discharge or lesions, UTERUS: uterus is normal size, shape, consistency and nontender, ADNEXA: normal adnexa in size, and no masses.  Appropriately tender on exam based on recent PID.  No cervical motion tenderness  Extremities: no edema   Chaperone:  pt declined     Assessment & Plan:  1) PID - Improving appropriately, encouraged patient to complete full course of antibiotic  2) contraceptive management - Discussed all options patient wishes trial of patch OCP risk assessment: Pt denies personal history of VTE, stroke or heart attack.  Denies personal h/o breast cancer.  +tobacco use, pt under the age of 28yo.  Denies h/o migraines with aura  3) HSV - Discussed findings on exam and swab obtained - Reviewed pelvic rest until blisters have resolved.  Discussed treatment as needed or suppression therapy should she note recurrent outbreaks.  Reassured patient that is not surprising that based on recent hospitalization and treatment that an outbreak occurred - Prescription sent in  4) Preventive screening - Pap collected, reviewed ASCCP guidelines -Swab  will also check for yeast or BV   Meds ordered this encounter  Medications   norelgestromin -ethinyl estradiol  (XULANE) 150-35 MCG/24HR transdermal patch    Sig: Place 1 patch onto the skin once a week. For 3 weeks, then one week without a patch    Dispense:  9 patch    Refill:  4   valACYclovir  (VALTREX ) 500 MG tablet    Sig: Take 1 tablet (500 mg total) by mouth 2 (two) times daily for 3 days.    Dispense:  6 tablet    Refill:  3     Orders Placed This Encounter  Procedures   Herpes simplex virus culture    Return in about 6 months (around 07/18/2024) for medication follow up.   Talina Pleitez, DO Attending Obstetrician & Gynecologist, [facility] for [name], [facility]

## 2024-01-21 LAB — HERPES SIMPLEX VIRUS CULTURE

## 2024-01-23 ENCOUNTER — Other Ambulatory Visit: Payer: Self-pay | Admitting: Obstetrics & Gynecology

## 2024-01-23 ENCOUNTER — Encounter: Payer: Self-pay | Admitting: Obstetrics & Gynecology

## 2024-01-23 DIAGNOSIS — Z113 Encounter for screening for infections with a predominantly sexual mode of transmission: Secondary | ICD-10-CM

## 2024-01-23 DIAGNOSIS — Z20828 Contact with and (suspected) exposure to other viral communicable diseases: Secondary | ICD-10-CM

## 2024-01-23 LAB — CYTOLOGY - PAP
Chlamydia: NEGATIVE
Comment: NEGATIVE
Comment: NEGATIVE
Comment: NEGATIVE
Comment: NEGATIVE
Comment: NORMAL
Diagnosis: UNDETERMINED — AB
HPV 16: NEGATIVE
HPV 18 / 45: NEGATIVE
High risk HPV: POSITIVE — AB
Neisseria Gonorrhea: NEGATIVE

## 2024-01-23 NOTE — Progress Notes (Signed)
 Order for lab work  Tracy Glodowski, DO Attending Obstetrician & Gynecologist, Biochemist, clinical for [name], [name]

## 2024-01-23 NOTE — Telephone Encounter (Signed)
 Called patient- lab work orders placed Xenia Nile, DO Attending Obstetrician & Gynecologist, Biochemist, clinical for [name], [facility]

## 2024-01-25 ENCOUNTER — Encounter: Payer: Self-pay | Admitting: Obstetrics & Gynecology

## 2024-01-25 ENCOUNTER — Other Ambulatory Visit: Payer: Self-pay | Admitting: Obstetrics & Gynecology

## 2024-01-25 DIAGNOSIS — N760 Acute vaginitis: Secondary | ICD-10-CM

## 2024-01-25 LAB — RPR: RPR Ser Ql: NONREACTIVE

## 2024-01-25 LAB — HIV ANTIBODY (ROUTINE TESTING W REFLEX): HIV Screen 4th Generation wRfx: NONREACTIVE

## 2024-01-25 LAB — HSV 1 AND 2 AB, IGG
HSV 1 Glycoprotein G Ab, IgG: NONREACTIVE
HSV 2 IgG, Type Spec: REACTIVE — AB

## 2024-01-25 MED ORDER — FLUCONAZOLE 150 MG PO TABS: 150.0000 mg | ORAL_TABLET | Freq: Once | ORAL | 2 refills | Status: AC

## 2024-01-25 NOTE — Progress Notes (Signed)
 Pt called and reviewed lab results as well as pap results.  Plan to schedule for colposcopy Diflucan  sen tin   Keene Pastures, DO Attending Obstetrician & Gynecologist, [facility] for [name], [facility]

## 2024-02-12 ENCOUNTER — Encounter: Payer: Self-pay | Admitting: Women's Health

## 2024-02-12 ENCOUNTER — Other Ambulatory Visit (HOSPITAL_COMMUNITY)
Admission: RE | Admit: 2024-02-12 | Discharge: 2024-02-12 | Disposition: A | Discharge status: Home / Self Care | Source: Ambulatory Visit | Attending: Women's Health | Admitting: Women's Health

## 2024-02-12 ENCOUNTER — Ambulatory Visit: Admitting: Women's Health

## 2024-02-12 VITALS — BP 115/74 | HR 74 | Ht 68.0 in | Wt 160.0 lb

## 2024-02-12 DIAGNOSIS — R87610 Atypical squamous cells of undetermined significance on cytologic smear of cervix (ASC-US): Secondary | ICD-10-CM

## 2024-02-12 DIAGNOSIS — R87619 Unspecified abnormal cytological findings in specimens from cervix uteri: Secondary | ICD-10-CM | POA: Insufficient documentation

## 2024-02-12 DIAGNOSIS — Z3009 Encounter for other general counseling and advice on contraception: Secondary | ICD-10-CM

## 2024-02-12 NOTE — Progress Notes (Signed)
   COLPOSCOPY PROCEDURE NOTE Patient name: Tracy Stout MRN [id number]  Date of birth: [birth year] Subjective Findings:   Tracy Stout is a 28 y.o. G57P1001 Caucasian female being seen today for a colposcopy. Indication: Abnormal pap on 01/17/24: ASCUS w/ HRHPV positive: other (not 16, 18/45)  Prior cytology: none, this was 1st pap  [name] last menstrual period was 01/31/2024. Contraception: none, stopped patch d/t mood swings, wants Mirena IUD, had sex last night for 1st time in 3wks, used condom and pullout. Unable to void today for UPT prior to colpo after 3 cups of water and multiple attempts (voided right before coming in) Menopausal: no. Hysterectomy: no.   Considering pregnancy: not right now, but in future  Smoker: yes. Immunocompromised: no.   The risks and benefits were explained and informed consent was obtained, and written copy is in chart. Pertinent History Reviewed:   Reviewed past medical,surgical, social, obstetrical and family history.  Reviewed problem list, medications and allergies. Objective Findings & Procedure:   Vitals:   02/12/24 1117  BP: 115/74  Pulse: 74  Weight: 160 lb (72.6 kg)  Height: 5\' 8"  (1.727 m)  Body mass index is 24.33 kg/m.  No results found for this or any previous visit (from the past 24 hours).   Time out was performed.  Speculum placed in the vagina, cervix fully visualized. SCJ: not fully visualized. Cervix swabbed x 3 with acetic acid.  Acetowhitening present: Yes Cervix: no visible lesions, no mosaicism, no punctation, no abnormal vasculature, and acetowhite lesion(s) noted at 11 o'clock. Endocervical curettage performed, Cervical biopsies taken at 11 o'clock, and Hemostasis achieved with Monsel's solution. Vagina: vaginal colposcopy not performed Vulva: vulvar colposcopy not performed  Specimens: 2  Complications: none  Chaperone: [name]  Unable to void for UPT (see note above)  Colposcopic Impression & Plan:    Colposcopy findings consistent with LSIL Plan: Post biopsy instructions given, Will notify patient of results when back, and Will base plan of care on pathology results and ASCCP guidelines  Contraception counseling>wants Mirena, sex last night, no sex until after insertion  Return in about 2 weeks (around 02/26/2024) for IUD insertion; 61yr for pap & physical.  Ferd Householder CNM, [facility] 02/12/2024 12:21 PM

## 2024-02-12 NOTE — Patient Instructions (Signed)
 Colposcopy, Care After  The following information offers guidance on how to care for yourself after your procedure. Your health care provider may also give you more specific instructions. If you have problems or questions, contact your health care provider. What can I expect after the procedure? If you had a colposcopy without a biopsy, you can expect to feel fine right away after your procedure. However, you may have some spotting of blood for a few days. You can return to your normal activities. If you had a colposcopy with a biopsy, it is common after the procedure to have: Soreness and mild pain. These may last for a few days. Mild vaginal bleeding or discharge that is dark-colored and grainy. This may last for a few days. The discharge may be caused by a liquid (solution) that was used during the procedure. You may need to wear a sanitary pad during this time. Spotting of blood for at least 48 hours after the procedure. Follow these instructions at home: Medicines Take over-the-counter and prescription medicines only as told by your health care provider. Talk with your health care provider about what type of over-the-counter pain medicines and prescription medicines you can start to take again. It is especially important to talk with your health care provider if you take blood thinners. Activity Avoid using douche products, using tampons, and having sex for at least 3 days after the procedure or for as long as told by your health care provider. Return to your normal activities as told by your health care provider. Ask your health care provider what activities are safe for you. General instructions Ask your health care provider if you may take baths, swim, or use a hot tub. You may take showers. If you use birth control (contraception), continue to use it. Keep all follow-up visits. This is important. Contact a health care provider if: You have a fever or chills. You faint or feel  light-headed. Get help right away if: You have heavy bleeding from your vagina or pass blood clots. Heavy bleeding is bleeding that soaks through a sanitary pad in less than 1 hour. You have vaginal discharge that is abnormal, is yellow in color, or smells bad. This could be a sign of infection. You have severe pain or cramps in your lower abdomen that do not go away with medicine. Summary If you had a colposcopy without a biopsy, you can expect to feel fine right away, but you may have some spotting of blood for a few days. You can return to your normal activities. If you had a colposcopy with a biopsy, it is common to have mild pain for a few days and spotting for 48 hours after the procedure. Avoid using douche products, using tampons, and having sex for at least 3 days after the procedure or for as long as told by your health care provider. Get help right away if you have heavy bleeding, severe pain, or signs of infection. This information is not intended to replace advice given to you by your health care provider. Make sure you discuss any questions you have with your health care provider. Document Revised: 02/07/2021 Document Reviewed: 02/07/2021 Elsevier Patient Education  2024 [employer].

## 2024-02-12 NOTE — Addendum Note (Signed)
 Addended by: Laurinda Porch A on: 02/12/2024 12:30 PM   Modules accepted: Orders

## 2024-02-13 ENCOUNTER — Ambulatory Visit: Payer: Self-pay | Admitting: Women's Health

## 2024-02-13 LAB — SURGICAL PATHOLOGY

## 2024-02-26 ENCOUNTER — Ambulatory Visit: Admitting: Family

## 2024-03-07 ENCOUNTER — Ambulatory Visit: Admitting: Women's Health

## 2024-03-11 ENCOUNTER — Ambulatory Visit (INDEPENDENT_AMBULATORY_CARE_PROVIDER_SITE_OTHER): Admitting: Family

## 2024-03-11 VITALS — BP 100/66 | HR 62 | Temp 97.7°F | Ht 68.0 in | Wt 150.2 lb

## 2024-03-11 DIAGNOSIS — F17219 Nicotine dependence, cigarettes, with unspecified nicotine-induced disorders: Secondary | ICD-10-CM | POA: Insufficient documentation

## 2024-03-11 DIAGNOSIS — J069 Acute upper respiratory infection, unspecified: Secondary | ICD-10-CM

## 2024-03-11 DIAGNOSIS — R062 Wheezing: Secondary | ICD-10-CM | POA: Diagnosis not present

## 2024-03-11 DIAGNOSIS — F39 Unspecified mood [affective] disorder: Secondary | ICD-10-CM | POA: Diagnosis not present

## 2024-03-11 MED ORDER — AZITHROMYCIN 250 MG PO TABS: ORAL_TABLET | ORAL | 0 refills | Status: AC

## 2024-03-11 MED ORDER — ALBUTEROL SULFATE HFA 108 (90 BASE) MCG/ACT IN AERS: 2.0000 | INHALATION_SPRAY | Freq: Four times a day (QID) | RESPIRATORY_TRACT | 0 refills | Status: AC | PRN

## 2024-03-11 NOTE — Assessment & Plan Note (Signed)
 On Lamictal for mood stabilization, just started a month ago. Managed by mental health professional. - Continue f/u with Psych

## 2024-03-11 NOTE — Progress Notes (Signed)
 New Patient Office Visit  Subjective:  Patient ID: Tracy Stout, female    DOB: 1996/01/01  Age: 28 y.o. MRN: [id number]  CC:  Chief Complaint  Patient presents with   New Patient (Initial Visit)    States congestion/cough.    HPI Tracy Stout presents for establishing care today.  Discussed the use of AI scribe software for clinical note transcription with the patient, who gave verbal consent to proceed.  History of Present Illness Tracy Stout is a 28 year old female who presents with a cough and chest congestion. She is accompanied by her boyfriend.  She has experienced a productive cough and chest congestion for over a week, with a 'gurgling' sensation when breathing. Thick mucus is expelled from her nose and through coughing. There is no throat or teeth pain, and no sinus tenderness. She smokes approximately half a pack of cigarettes daily. She takes Lamictal for mood stabilization and has no known medication allergies.  Assessment & Plan URI - Wheezing indicates bronchial inflammation. Smoking increases lung infection risk. Discussed albuterol  inhaler and azithromycin  use and side effects. - Prescribed albuterol  inhaler for wheezing and dyspnea. Instructed on proper use: shake, remove cap, inhale while pressing down, hold breath, repeat. Use as needed q6h. - Prescribed azithromycin : two pills on day one, then one daily. Advised to take with food to prevent nausea. - Call office if sx are not better after finishing antibiotic  Mood Disorder - On Lamictal for mood stabilization, just started a month ago. Managed by mental health professional. - Continue f/u with Psych  Nicotine  dependence - Reports smoking 1ppd most days. Discussed risk of frequent lung infections. Discussed importance of smoking cessation d/t ill effects on overall health, as well as different options available.  Encouraged smoking cessation.  Gauged progress on stages of change.  [place]ided [place]  resource.     General Health Maintenance Smoking cessation advised for respiratory health improvement. - Advised scheduling a physical examination with labs if not done recently. - Encouraged smoking cessation.     Subjective:    Outpatient Medications Prior to Visit  Medication Sig Dispense Refill   lamotrigine (LAMICTAL) 25 MG disintegrating tablet Take 25 mg by mouth daily.     hydrOXYzine (VISTARIL) 100 MG capsule Take 100 mg by mouth 3 (three) times daily as needed for itching. (Patient not taking: Reported on 03/11/2024)     ibuprofen  (ADVIL ) 600 MG tablet Take 1 tablet (600 mg total) by mouth every 6 (six) hours as needed. 30 tablet 0   norelgestromin -ethinyl estradiol  (XULANE) 150-35 MCG/24HR transdermal patch Place 1 patch onto the skin once a week. For 3 weeks, then one week without a patch 9 patch 4   polyethylene glycol powder (GLYCOLAX /MIRALAX ) 17 GM/SCOOP powder Take 17 g by mouth daily. (Patient not taking: Reported on 01/05/2024) 238 g 1   sertraline  (ZOLOFT ) 50 MG tablet Take 50 mg by mouth daily. (Patient not taking: Reported on 03/11/2024)     simethicone  (MYLICON) 80 MG chewable tablet Chew 1 tablet (80 mg total) by mouth 4 (four) times daily as needed for flatulence (gas). (Patient not taking: Reported on 01/05/2024) 30 tablet 0   No facility-administered medications prior to visit.   Past Medical History:  Diagnosis Date   ADHD (attention deficit hyperactivity disorder)    ADHD (attention deficit hyperactivity disorder), combined type 07/23/2013   Anemia    Anxiety    Bipolar 1 disorder (HCC)    Blood transfusion without reported diagnosis  Borderline personality disorder (HCC)    Depression    Drug use 11/07/2016   +opiates/cocaine/benzos/THC month prior to pregnancy      Repeats during pregnancy + for [facility] only  Patient avoids submitting urine sample (5/2), left w/o submitting. NEEDS RETESTING     Hemoperitoneum 12/28/2023   History of pyelonephritis during  pregnancy 10/06/2016   Admitted 10/06/16> progressed to pneumonia/ARDS> tx to [facility], on ventilator for few days, d/c'd 10/15/16       On Keflex  suppression     Pneumonia    Polysubstance abuse (HCC)    PTSD (post-traumatic stress disorder)    Self-inflicted injury    Substance abuse (HCC)    Suicide attempt [facility])    Past Surgical History:  Procedure Laterality Date   tubes in ears      Objective:   Today's Vitals: BP 100/66   Pulse 62   Temp 97.7 F (36.5 C) (Temporal)   Ht 5' 8 (1.727 m)   Wt 150 lb 3.2 oz (68.1 kg)   LMP 02/24/2024 (Exact Date)   SpO2 97%   BMI 22.84 kg/m   Physical Exam Vitals and nursing note reviewed.  Constitutional:      Appearance: Normal appearance.   Cardiovascular:     Rate and Rhythm: Normal rate and regular rhythm.  Pulmonary:     Effort: Pulmonary effort is normal.     Breath sounds: Examination of the right-upper field reveals wheezing. Examination of the left-upper field reveals wheezing. Examination of the right-middle field reveals wheezing. Wheezing (inspiratory) present.   Musculoskeletal:        General: Normal range of motion.   Skin:    General: Skin is warm and dry.   Neurological:     Mental Status: She is alert.   Psychiatric:        Mood and Affect: Mood normal.        Behavior: Behavior normal.     Meds ordered this encounter  Medications   albuterol  (VENTOLIN  HFA) 108 (90 Base) MCG/ACT inhaler    Sig: Inhale 2 puffs into the lungs every 6 (six) hours as needed for wheezing or shortness of breath.    Dispense:  8 g    Refill:  0    Supervising Provider:   ANDY, CAMILLE L [[id number]]   azithromycin  (ZITHROMAX ) 250 MG tablet    Sig: Take 2 tablets on day 1, then 1 tablet daily on days 2 through 5    Dispense:  6 tablet    Refill:  0    Supervising Provider:   ANDY, CAMILLE L [[id number]]    Versa Gore, NP

## 2024-03-11 NOTE — Assessment & Plan Note (Signed)
 Reports smoking 1ppd most days. Discussed risk of frequent lung infections. Discussed importance of smoking cessation d/t ill effects on overall health, as well as different options available.  Encouraged smoking cessation.  Gauge progress on stages of change.  Provided [place] resource.

## 2024-03-11 NOTE — Patient Instructions (Addendum)
 Welcome to [facility], It was a pleasure meeting you today!    As discussed, I have sent your medications to your pharmacy.  Please stop smoking! If you need a medication to help you quit, let me know. See the handout attached.  You can schedule a physical with fasting labs when convenient for you.    PLEASE NOTE: If you had any LAB tests please let us  know if you have not heard back within a few days. You may see your results on MyChart before we have a chance to review them but we will give you a call once they are reviewed by us . If we ordered any REFERRALS today, please let us  know if you have not heard from their office within the next week.  Let us  know through MyChart if you are needing REFILLS, or have your pharmacy send us  the request. You can also use MyChart to communicate with me or any office staff.  Please try these tips to maintain a healthy lifestyle: It is important that you exercise regularly at least 30 minutes 5 times a week. Think about what you will eat, plan ahead. Choose whole foods, & think  clean, green, fresh or frozen over canned, processed or packaged foods which are more sugary, salty, and fatty. 70 to 75% of food eaten should be fresh vegetables and protein. 2-3  meals daily with healthy snacks between meals, but must be whole fruit, protein or vegetables. Aim to eat over a 10 hour period when you are active, for example, 7am to 5pm, and then STOP after your last meal of the day, drinking only water.  Shorter eating windows, 6-8 hours, are showing benefits in heart disease and blood sugar regulation. Drink water every day! Shoot for 64 ounces daily = 8 cups, no other drink is as healthy! Fruit juice is best enjoyed in a healthy way, by EATING the fruit.

## 2024-03-12 ENCOUNTER — Encounter: Payer: Self-pay | Admitting: Family

## 2024-05-05 ENCOUNTER — Encounter: Payer: Self-pay | Admitting: Obstetrics & Gynecology

## 2024-05-06 ENCOUNTER — Other Ambulatory Visit: Payer: Self-pay | Admitting: Obstetrics & Gynecology

## 2024-05-06 DIAGNOSIS — A609 Anogenital herpesviral infection, unspecified: Secondary | ICD-10-CM

## 2024-05-06 MED ORDER — VALACYCLOVIR HCL 500 MG PO TABS: 500.0000 mg | ORAL_TABLET | Freq: Two times a day (BID) | ORAL | 3 refills | Status: AC

## 2024-05-06 NOTE — Progress Notes (Signed)
 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

## 2024-05-10 ENCOUNTER — Emergency Department (HOSPITAL_BASED_OUTPATIENT_CLINIC_OR_DEPARTMENT_OTHER): Admission: EM | Admit: 2024-05-10 | Discharge: 2024-05-10 | Disposition: A | Discharge status: Home / Self Care

## 2024-05-10 ENCOUNTER — Emergency Department (HOSPITAL_COMMUNITY)
Admission: EM | Admit: 2024-05-10 | Discharge: 2024-05-11 | Disposition: A | Discharge status: Home / Self Care | Attending: Emergency Medicine | Admitting: Emergency Medicine

## 2024-05-10 ENCOUNTER — Other Ambulatory Visit: Payer: Self-pay

## 2024-05-10 ENCOUNTER — Emergency Department (HOSPITAL_COMMUNITY)

## 2024-05-10 DIAGNOSIS — R102 Pelvic and perineal pain: Secondary | ICD-10-CM | POA: Insufficient documentation

## 2024-05-10 DIAGNOSIS — R1031 Right lower quadrant pain: Secondary | ICD-10-CM | POA: Diagnosis not present

## 2024-05-10 LAB — COMPREHENSIVE METABOLIC PANEL WITH GFR
ALT: 12 U/L (ref 0–44)
AST: 19 U/L (ref 15–41)
Albumin: 4 g/dL (ref 3.5–5.0)
Alkaline Phosphatase: 51 U/L (ref 38–126)
Anion gap: 7 (ref 5–15)
BUN: 12 mg/dL (ref 6–20)
CO2: 27 mmol/L (ref 22–32)
Calcium: 8.9 mg/dL (ref 8.9–10.3)
Chloride: 102 mmol/L (ref 98–111)
Creatinine, Ser: 0.84 mg/dL (ref 0.44–1.00)
GFR, Estimated: >60 mL/min (ref >60)
Glucose, Bld: 90 mg/dL (ref 70–99)
Potassium: 4.2 mmol/L (ref 3.5–5.1)
Sodium: 136 mmol/L (ref 135–145)
Total Bilirubin: 0.4 mg/dL (ref 0.0–1.2)
Total Protein: 7.3 g/dL (ref 6.5–8.1)

## 2024-05-10 LAB — CBC
HCT: 40.3 % (ref 36.0–46.0)
Hemoglobin: 13.7 g/dL (ref 12.0–15.0)
MCH: 31.4 pg (ref 26.0–34.0)
MCHC: 34 g/dL (ref 30.0–36.0)
MCV: 92.2 fL (ref 80.0–100.0)
Platelets: 174 K/uL (ref 150–400)
RBC: 4.37 MIL/uL (ref 3.87–5.11)
RDW: 12.5 % (ref 11.5–15.5)
WBC: 7.4 K/uL (ref 4.0–10.5)
nRBC: 0 % (ref 0.0–0.2)

## 2024-05-10 LAB — URINALYSIS, ROUTINE W REFLEX MICROSCOPIC
Bilirubin Urine: NEGATIVE
Glucose, UA: NEGATIVE mg/dL
Hgb urine dipstick: NEGATIVE
Ketones, ur: NEGATIVE mg/dL
Leukocytes,Ua: NEGATIVE
Nitrite: NEGATIVE
Protein, ur: NEGATIVE mg/dL
Specific Gravity, Urine: 1.019 (ref 1.005–1.030)
pH: 5 (ref 5.0–8.0)

## 2024-05-10 LAB — LIPASE, BLOOD: Lipase: 26 U/L (ref 11–51)

## 2024-05-10 LAB — POC URINE PREG, ED: Preg Test, Ur: NEGATIVE

## 2024-05-10 MED ORDER — MORPHINE SULFATE (PF) 4 MG/ML IV SOLN
4.0000 mg | Freq: Once | INTRAVENOUS | Status: AC
  Administered 2024-05-10: 4 mg via INTRAVENOUS
  Filled 2024-05-10: qty 1

## 2024-05-10 NOTE — ED Triage Notes (Signed)
 Pt c/o suprapubic pain x1 day, states she's had ruptured cyst in the past and pain is the same. Also c/o of pain with intercourse

## 2024-05-10 NOTE — ED Provider Notes (Signed)
 [place]  EMERGENCY DEPARTMENT AT [facility]  Provider Note  CSN: [id number] Arrival date & time: 05/10/24 2043  History Chief Complaint  Patient presents with   Abdominal Pain    Tracy Stout is a 28 y.o. female here with 1 day of gradually worsening pelvic pain, R>L, not associated with fever, N/V/D or dysuria. No vaginal bleeding or discharge. She is due for her menses and is unsure if she may be pregnant. She had similar pain in Apr 2025 when she had imaging with a hemoperitoneum felt to be due to rupture ovarian cyst and persistent pain resulting in admission and additional ED visits as well as treatment for possible PID. She has been doing well since then.    Home Medications Prior to Admission medications   Medication Sig Start Date End Date Taking? Authorizing Provider  HYDROcodone -acetaminophen  (NORCO/VICODIN) 5-325 MG tablet Take 1 tablet by mouth every 6 (six) hours as needed for severe pain (pain score 7-10). 05/11/24  Yes Roselyn Carlin NOVAK, MD  albuterol  (VENTOLIN  HFA) 108 (90 Base) MCG/ACT inhaler Inhale 2 puffs into the lungs every 6 (six) hours as needed for wheezing or shortness of breath. 03/11/24   Lucius Krabbe, NP  lamotrigine (LAMICTAL) 25 MG disintegrating tablet Take 25 mg by mouth daily. 02/08/24   [provider]  valACYclovir  (VALTREX ) 500 MG tablet Take 1 tablet (500 mg total) by mouth 2 (two) times daily for 5 days. 05/06/24 05/11/24  Ozan, Jennifer, DO     Allergies    Bee venom   Review of Systems   Review of Systems Please see HPI for pertinent positives and negatives  Physical Exam BP (!) 99/55   Pulse (!) 58   Temp 98.8 F (37.1 C) (Tympanic)   Resp 16   Wt 68.9 kg   LMP 04/17/2024   SpO2 94%   BMI 23.11 kg/m   Physical Exam Vitals and nursing note reviewed.  Constitutional:      Appearance: Normal appearance.  HENT:     Head: Normocephalic and atraumatic.     Nose: Nose normal.     Mouth/Throat:     Mouth:  Mucous membranes are moist.  Eyes:     Extraocular Movements: Extraocular movements intact.     Conjunctiva/sclera: Conjunctivae normal.  Cardiovascular:     Rate and Rhythm: Normal rate.  Pulmonary:     Effort: Pulmonary effort is normal.     Breath sounds: Normal breath sounds.  Abdominal:     General: Abdomen is flat.     Palpations: Abdomen is soft.     Tenderness: There is abdominal tenderness in the right lower quadrant and suprapubic area. There is guarding. Positive signs include McBurney's sign. Negative signs include Murphy's sign.  Musculoskeletal:        General: No swelling. Normal range of motion.     Cervical back: Neck supple.  Skin:    General: Skin is warm and dry.  Neurological:     General: No focal deficit present.     Mental Status: She is alert.  Psychiatric:        Mood and Affect: Mood normal.     ED Results / Procedures / Treatments   EKG None  Procedures Procedures  Medications Ordered in the ED Medications  HYDROcodone -acetaminophen  (NORCO/VICODIN) 5-325 MG per tablet 1 tablet (has no administration in time range)  morphine  (PF) 4 MG/ML injection 4 mg (4 mg Intravenous Given 05/10/24 2342)  iohexol  (OMNIPAQUE ) 300 MG/ML solution 100  mL (100 mLs Intravenous Contrast Given 05/11/24 0004)    Initial Impression and Plan  Patient here with R>L pelvic pain, her prior ovarian cyst was left sided. Exam with some mild/early peritoneal signs. Vitals are reassuring. Labs done in triage show normal CBC, CMP and lipase. Will give pain medications for comfort. Imaging pending pregnancy test.   ED Course   Clinical Course as of 05/11/24 0120  Fri May 10, 2024  2349 HCG is neg. UA is clear. Will send for CT.  [CS]  Sat May 11, 2024  0118 I personally viewed the images from radiology studies and agree with radiologist interpretation: CT is neg for acute process. Will give Rx for pain medications and recommend outpatient Gyn follow up if symptoms persist.   [CS]    Clinical Course User Index [CS] Roselyn Carlin NOVAK, MD     MDM Rules/Calculators/A&P Medical Decision Making Problems Addressed: Pelvic pain: acute illness or injury  Amount and/or Complexity of Data Reviewed Labs: ordered. Decision-making details documented in ED Course. Radiology: ordered and independent interpretation performed. Decision-making details documented in ED Course.  Risk Prescription drug management. Parenteral controlled substances.     Final Clinical Impression(s) / ED Diagnoses Final diagnoses:  Pelvic pain    Rx / DC Orders ED Discharge Orders          Ordered    HYDROcodone -acetaminophen  (NORCO/VICODIN) 5-325 MG tablet  Every 6 hours PRN        05/11/24 0120             Roselyn Carlin NOVAK, MD 05/11/24 0120

## 2024-05-11 MED ORDER — HYDROCODONE-ACETAMINOPHEN 5-325 MG PO TABS
1.0000 | ORAL_TABLET | Freq: Once | ORAL | Status: AC
  Administered 2024-05-11: 1 via ORAL
  Filled 2024-05-11: qty 1

## 2024-05-11 MED ORDER — HYDROCODONE-ACETAMINOPHEN 5-325 MG PO TABS: 1.0000 | ORAL_TABLET | Freq: Four times a day (QID) | ORAL | 0 refills | Status: AC | PRN

## 2024-05-11 MED ORDER — IOHEXOL 300 MG/ML  SOLN
100.0000 mL | Freq: Once | INTRAMUSCULAR | Status: AC | PRN
  Administered 2024-05-11: 100 mL via INTRAVENOUS

## 2024-06-05 ENCOUNTER — Other Ambulatory Visit: Payer: Self-pay

## 2024-06-05 ENCOUNTER — Encounter (HOSPITAL_BASED_OUTPATIENT_CLINIC_OR_DEPARTMENT_OTHER): Payer: Self-pay

## 2024-06-05 DIAGNOSIS — S8002XA Contusion of left knee, initial encounter: Secondary | ICD-10-CM | POA: Insufficient documentation

## 2024-06-05 DIAGNOSIS — R102 Pelvic and perineal pain: Secondary | ICD-10-CM | POA: Insufficient documentation

## 2024-06-05 DIAGNOSIS — S8992XA Unspecified injury of left lower leg, initial encounter: Secondary | ICD-10-CM | POA: Diagnosis present

## 2024-06-05 DIAGNOSIS — S0990XA Unspecified injury of head, initial encounter: Secondary | ICD-10-CM | POA: Insufficient documentation

## 2024-06-05 DIAGNOSIS — W01198A Fall on same level from slipping, tripping and stumbling with subsequent striking against other object, initial encounter: Secondary | ICD-10-CM | POA: Insufficient documentation

## 2024-06-05 DIAGNOSIS — R1084 Generalized abdominal pain: Secondary | ICD-10-CM | POA: Diagnosis not present

## 2024-06-05 LAB — COMPREHENSIVE METABOLIC PANEL WITH GFR
ALT: 11 U/L (ref 0–44)
AST: 22 U/L (ref 15–41)
Albumin: 4.1 g/dL (ref 3.5–5.0)
Alkaline Phosphatase: 66 U/L (ref 38–126)
Anion gap: 11 (ref 5–15)
BUN: 13 mg/dL (ref 6–20)
CO2: 27 mmol/L (ref 22–32)
Calcium: 9.2 mg/dL (ref 8.9–10.3)
Chloride: 102 mmol/L (ref 98–111)
Creatinine, Ser: 0.94 mg/dL (ref 0.44–1.00)
GFR, Estimated: >60 mL/min (ref >60)
Glucose, Bld: 100 mg/dL — ABNORMAL HIGH (ref 70–99)
Potassium: 4.2 mmol/L (ref 3.5–5.1)
Sodium: 140 mmol/L (ref 135–145)
Total Bilirubin: 0.5 mg/dL (ref 0.0–1.2)
Total Protein: 6.8 g/dL (ref 6.5–8.1)

## 2024-06-05 LAB — HCG, SERUM, QUALITATIVE: Preg, Serum: NEGATIVE

## 2024-06-05 LAB — CBC
HCT: 37 % (ref 36.0–46.0)
Hemoglobin: 12.5 g/dL (ref 12.0–15.0)
MCH: 30.6 pg (ref 26.0–34.0)
MCHC: 33.8 g/dL (ref 30.0–36.0)
MCV: 90.5 fL (ref 80.0–100.0)
Platelets: 129 K/uL — ABNORMAL LOW (ref 150–400)
RBC: 4.09 MIL/uL (ref 3.87–5.11)
RDW: 12.6 % (ref 11.5–15.5)
WBC: 4.4 K/uL (ref 4.0–10.5)
nRBC: 0 % (ref 0.0–0.2)

## 2024-06-05 NOTE — ED Triage Notes (Signed)
 Fall from [employer] onto side. Says mower flipped with  her but did not land on her.   Sts she hit her head on the ground and has abrasions to right knee. Also has pelvic pain.   Describes nausea, and presyncope feelings since the accident.

## 2024-06-06 ENCOUNTER — Emergency Department (HOSPITAL_BASED_OUTPATIENT_CLINIC_OR_DEPARTMENT_OTHER)

## 2024-06-06 ENCOUNTER — Emergency Department (HOSPITAL_BASED_OUTPATIENT_CLINIC_OR_DEPARTMENT_OTHER)
Admission: EM | Admit: 2024-06-06 | Discharge: 2024-06-06 | Disposition: A | Discharge status: Home / Self Care | Attending: Emergency Medicine | Admitting: Emergency Medicine

## 2024-06-06 DIAGNOSIS — S0990XA Unspecified injury of head, initial encounter: Secondary | ICD-10-CM

## 2024-06-06 DIAGNOSIS — S8002XA Contusion of left knee, initial encounter: Secondary | ICD-10-CM

## 2024-06-06 MED ORDER — FENTANYL CITRATE PF 50 MCG/ML IJ SOSY
25.0000 ug | PREFILLED_SYRINGE | Freq: Once | INTRAMUSCULAR | Status: AC
  Administered 2024-06-06: 25 ug via INTRAVENOUS
  Filled 2024-06-06: qty 1

## 2024-06-06 MED ORDER — IBUPROFEN 800 MG PO TABS: 800.0000 mg | ORAL_TABLET | Freq: Four times a day (QID) | ORAL | 0 refills | Status: AC | PRN

## 2024-06-06 MED ORDER — IOHEXOL 300 MG/ML  SOLN
100.0000 mL | Freq: Once | INTRAMUSCULAR | Status: AC | PRN
  Administered 2024-06-06: 100 mL via INTRAVENOUS

## 2024-06-06 MED ORDER — METHOCARBAMOL 500 MG PO TABS: 500.0000 mg | ORAL_TABLET | Freq: Three times a day (TID) | ORAL | 0 refills | Status: AC | PRN

## 2024-06-06 NOTE — ED Provider Notes (Signed)
 [place] EMERGENCY DEPARTMENT AT [facility] Provider Note   CSN: [id number] Arrival date & time: 06/05/24  2142     Patient presents with: Lawnmower Accident    Tracy Stout is a 28 y.o. female.   Patient presents to the emergency department for evaluation after injury.  Patient reports that she was riding a lawnmower, hit a root and it threw her off of the mower.  She reports that she hit her head on the mower as she was ejected and then she was also wearing a leaf blower backpack which hit her on the head as well.  She complains of headache and right knee pain.       Prior to Admission medications   Medication Sig Start Date End Date Taking? Authorizing Provider  ibuprofen  (ADVIL ) 800 MG tablet Take 1 tablet (800 mg total) by mouth every 6 (six) hours as needed for moderate pain (pain score 4-6). 06/06/24  Yes Anetra Czerwinski, Lonni PARAS, MD  methocarbamol  (ROBAXIN ) 500 MG tablet Take 1 tablet (500 mg total) by mouth every 8 (eight) hours as needed for muscle spasms. 06/06/24  Yes Juanya Villavicencio, Lonni PARAS, MD  albuterol  (VENTOLIN  HFA) 108 (90 Base) MCG/ACT inhaler Inhale 2 puffs into the lungs every 6 (six) hours as needed for wheezing or shortness of breath. 03/11/24   Lucius Krabbe, NP  HYDROcodone -acetaminophen  (NORCO/VICODIN) 5-325 MG tablet Take 1 tablet by mouth every 6 (six) hours as needed for severe pain (pain score 7-10). 05/11/24   Roselyn Carlin NOVAK, MD  lamotrigine (LAMICTAL) 25 MG disintegrating tablet Take 25 mg by mouth daily. 02/08/24   [provider]    Allergies: Bee venom    Review of Systems  Updated Vital Signs BP 97/63   Pulse 61   Temp 98.1 F (36.7 C) (Oral)   Resp 16   Ht 5' 8 (1.727 m)   Wt 68 kg   LMP 05/20/2024 (Approximate)   SpO2 97%   BMI 22.81 kg/m   Physical Exam Vitals and nursing note reviewed.  Constitutional:      General: She is not in acute distress.    Appearance: She is well-developed.  HENT:     Head:  Normocephalic and atraumatic.     Mouth/Throat:     Mouth: Mucous membranes are moist.  Eyes:     General: Vision grossly intact. Gaze aligned appropriately.     Extraocular Movements: Extraocular movements intact.     Conjunctiva/sclera: Conjunctivae normal.  Cardiovascular:     Rate and Rhythm: Normal rate and regular rhythm.     Pulses: Normal pulses.     Heart sounds: Normal heart sounds, S1 normal and S2 normal. No murmur heard.    No friction rub. No gallop.  Pulmonary:     Effort: Pulmonary effort is normal. No respiratory distress.     Breath sounds: Normal breath sounds.  Abdominal:     General: Bowel sounds are normal.     Palpations: Abdomen is soft.     Tenderness: There is generalized abdominal tenderness. There is no guarding or rebound.     Hernia: No hernia is present.  Musculoskeletal:        General: No swelling.     Cervical back: Full passive range of motion without pain, normal range of motion and neck supple. No spinous process tenderness or muscular tenderness. Normal range of motion.     Right knee: Ecchymosis present. No deformity. Normal range of motion. Tenderness present.     Right  lower leg: No edema.     Left lower leg: No edema.  Skin:    General: Skin is warm and dry.     Capillary Refill: Capillary refill takes less than 2 seconds.     Findings: No ecchymosis, erythema, rash or wound.  Neurological:     General: No focal deficit present.     Mental Status: She is alert and oriented to person, place, and time.     GCS: GCS eye subscore is 4. GCS verbal subscore is 5. GCS motor subscore is 6.     Cranial Nerves: Cranial nerves 2-12 are intact.     Sensory: Sensation is intact.     Motor: Motor function is intact.     Coordination: Coordination is intact.  Psychiatric:        Attention and Perception: Attention normal.        Mood and Affect: Mood normal.        Speech: Speech normal.        Behavior: Behavior normal.     (all labs ordered  are listed, but only abnormal results are displayed) Labs Reviewed  CBC - Abnormal; Notable for the following components:      Result Value   Platelets 129 (*)    All other components within normal limits  COMPREHENSIVE METABOLIC PANEL WITH GFR - Abnormal; Notable for the following components:   Glucose, Bld 100 (*)    All other components within normal limits  HCG, SERUM, QUALITATIVE  URINALYSIS, ROUTINE W REFLEX MICROSCOPIC    EKG: None  Radiology: CT CHEST ABDOMEN PELVIS W CONTRAST Result Date: 06/06/2024 EXAM: CT CHEST, ABDOMEN AND PELVIS WITH CONTRAST 06/06/2024 02:41:55 AM TECHNIQUE: CT of the chest, abdomen and pelvis was performed with the administration of intravenous contrast. Multiplanar reformatted images are provided for review. Automated exposure control, iterative reconstruction, and/or weight based adjustment of the mA/kV was utilized to reduce the radiation dose to as low as reasonably achievable. COMPARISON: CT abdomen and pelvis 05/11/2024 CLINICAL HISTORY: Polytrauma, blunt. Fall from [employer] onto side. Says mower flipped with her but did not land on her. States she hit her head on the ground and has abrasions to right knee. Also has pelvic pain. Describes nausea, and presyncope feelings since the accident. FINDINGS: CHEST: MEDIASTINUM AND LYMPH NODES: Residual thymic tissue in the anterior mediastinum. Heart and pericardium are unremarkable. The central airways are clear. No mediastinal, hilar or axillary lymphadenopathy. LUNGS AND PLEURA: No focal consolidation or pulmonary edema. No pleural effusion or pneumothorax. ABDOMEN AND PELVIS: LIVER: The liver is unremarkable. GALLBLADDER AND BILE DUCTS: Gallbladder is unremarkable. No biliary ductal dilatation. SPLEEN: No acute abnormality. PANCREAS: No acute abnormality. ADRENAL GLANDS: No acute abnormality. KIDNEYS, URETERS AND BLADDER: No stones in the kidneys or ureters. No hydronephrosis. No perinephric or periureteral  stranding. Urinary bladder is unremarkable. GI AND BOWEL: Stomach demonstrates no acute abnormality. There is no bowel obstruction. REPRODUCTIVE ORGANS: No acute abnormality. PERITONEUM AND RETROPERITONEUM: A small amount of free fluid in the pelvis is likely physiologic. No free air. VASCULATURE: Aorta is normal in caliber. ABDOMINAL AND PELVIS LYMPH NODES: No lymphadenopathy. REPRODUCTIVE ORGANS: No acute abnormality. BONES AND SOFT TISSUES: No acute osseous abnormality. No focal soft tissue abnormality. IMPRESSION: 1. No evidence of acute traumatic injury. Electronically signed by: Norman Gatlin MD 06/06/2024 02:52 AM EDT RP Workstation: [id number]   CT HEAD WO CONTRAST ( ) Result Date: 06/06/2024 CLINICAL DATA:  Initial evaluation for acute trauma, fall. EXAM: CT HEAD WITHOUT  CONTRAST CT CERVICAL SPINE WITHOUT CONTRAST TECHNIQUE: Multidetector CT imaging of the head and cervical spine was performed following the standard protocol without intravenous contrast. Multiplanar CT image reconstructions of the cervical spine were also generated. RADIATION DOSE REDUCTION: This exam was performed according to the departmental dose-optimization program which includes automated exposure control, adjustment of the mA and/or kV according to patient size and/or use of iterative reconstruction technique. COMPARISON:  None Available. FINDINGS: CT HEAD FINDINGS Brain: Cerebral volume within normal limits for patient age. No acute intracranial hemorrhage. No acute large vessel territory infarct. No mass lesion, midline shift, or mass effect. Ventricles are normal in size without hydrocephalus. No extra-axial fluid collection. Vascular: No abnormal hyperdense vessel. Skull: Scalp soft tissues demonstrate no acute abnormality. Subcentimeter nodular lesion at the right frontal scalp noted, nonspecific, but likely benign. Calvarium intact. Sinuses/Orbits: Globes and orbital soft tissues within normal limits. Visualized paranasal  sinuses are largely clear. No significant mastoid effusion. Other: None. CT CERVICAL SPINE FINDINGS Alignment: Straightening with smooth reversal of the normal cervical lordosis. No listhesis or malalignment. Skull base and vertebrae: Skull base intact. Normal C1-2 articulations are preserved and the dens is intact. Vertebral body heights maintained. No acute fracture. Soft tissues and spinal canal: Soft tissues of the neck demonstrate no acute finding. No abnormal prevertebral edema. Spinal canal within normal limits. Disc levels:  No significant disc pathology or stenosis by CT. Upper chest: Visualized upper chest demonstrates no acute finding. Partially visualized lung apices are clear. Other: None. IMPRESSION: 1. Normal head CT. No acute intracranial abnormality. 2. No acute traumatic injury within the cervical spine. Electronically Signed   By: Morene Hoard M.D.   On: 06/06/2024 02:52   CT CERVICAL SPINE WO CONTRAST Result Date: 06/06/2024 CLINICAL DATA:  Initial evaluation for acute trauma, fall. EXAM: CT HEAD WITHOUT CONTRAST CT CERVICAL SPINE WITHOUT CONTRAST TECHNIQUE: Multidetector CT imaging of the head and cervical spine was performed following the standard protocol without intravenous contrast. Multiplanar CT image reconstructions of the cervical spine were also generated. RADIATION DOSE REDUCTION: This exam was performed according to the departmental dose-optimization program which includes automated exposure control, adjustment of the mA and/or kV according to patient size and/or use of iterative reconstruction technique. COMPARISON:  None Available. FINDINGS: CT HEAD FINDINGS Brain: Cerebral volume within normal limits for patient age. No acute intracranial hemorrhage. No acute large vessel territory infarct. No mass lesion, midline shift, or mass effect. Ventricles are normal in size without hydrocephalus. No extra-axial fluid collection. Vascular: No abnormal hyperdense vessel. Skull:  Scalp soft tissues demonstrate no acute abnormality. Subcentimeter nodular lesion at the right frontal scalp noted, nonspecific, but likely benign. Calvarium intact. Sinuses/Orbits: Globes and orbital soft tissues within normal limits. Visualized paranasal sinuses are largely clear. No significant mastoid effusion. Other: None. CT CERVICAL SPINE FINDINGS Alignment: Straightening with smooth reversal of the normal cervical lordosis. No listhesis or malalignment. Skull base and vertebrae: Skull base intact. Normal C1-2 articulations are preserved and the dens is intact. Vertebral body heights maintained. No acute fracture. Soft tissues and spinal canal: Soft tissues of the neck demonstrate no acute finding. No abnormal prevertebral edema. Spinal canal within normal limits. Disc levels:  No significant disc pathology or stenosis by CT. Upper chest: Visualized upper chest demonstrates no acute finding. Partially visualized lung apices are clear. Other: None. IMPRESSION: 1. Normal head CT. No acute intracranial abnormality. 2. No acute traumatic injury within the cervical spine. Electronically Signed   By: Morene Hoard HERO.D.  On: 06/06/2024 02:52   DG Knee Complete 4 Views Right Result Date: 06/06/2024 CLINICAL DATA:  Initial evaluation for acute trauma, fall. EXAM: RIGHT KNEE - COMPLETE 4+ VIEW COMPARISON:  None Available. FINDINGS: No evidence of fracture, dislocation, or joint effusion. No evidence of arthropathy or other focal bone abnormality. Soft tissues are unremarkable. IMPRESSION: No acute osseous abnormality about the knee. Electronically Signed   By: Morene Hoard M.D.   On: 06/06/2024 02:36     Procedures   Medications Ordered in the ED  iohexol  (OMNIPAQUE ) 300 MG/ML solution 100 mL (100 mLs Intravenous Contrast Given 06/06/24 0224)  fentaNYL  (SUBLIMAZE ) injection 25 mcg (25 mcg Intravenous Given 06/06/24 0300)                                    Medical Decision Making Amount  and/or Complexity of Data Reviewed Labs: ordered. Decision-making details documented in ED Course. Radiology: ordered and independent interpretation performed. Decision-making details documented in ED Course.  Risk Prescription drug management.   Differential diagnosis considered includes, but not limited to: Blunt trauma including intracranial injury, spinal injury, thoracic injury, intra-abdominal and retroperitoneal injury, orthopedic injury  Presents to the emergency department for evaluation after an accident.  Patient fell off of a moving mower.  The mower did not tip, fall on her or run her over.  She did hit her head multiple times when she fell.  Patient with some borderline blood pressures, likely normal for her.  Full trauma workup was performed because of this.  CT head, cervical spine, chest, abdomen, pelvis.  No acute trauma noted.  Patient with pain and some bruising of the right knee.  Normal range of motion, no deformity.  X-ray negative.     Final diagnoses:  Injury of head, initial encounter  Contusion of left knee, initial encounter    ED Discharge Orders          Ordered    ibuprofen  (ADVIL ) 800 MG tablet  Every 6 hours PRN        06/06/24 0457    methocarbamol  (ROBAXIN ) 500 MG tablet  Every 8 hours PRN        06/06/24 0457               Haze Lonni PARAS, MD 06/06/24 [phone]

## 2024-06-27 ENCOUNTER — Telehealth: Payer: Self-pay | Admitting: Obstetrics & Gynecology

## 2024-06-27 ENCOUNTER — Other Ambulatory Visit: Payer: Self-pay | Admitting: Obstetrics & Gynecology

## 2024-06-27 DIAGNOSIS — A609 Anogenital herpesviral infection, unspecified: Secondary | ICD-10-CM

## 2024-06-27 MED ORDER — VALACYCLOVIR HCL 500 MG PO TABS: 500.0000 mg | ORAL_TABLET | Freq: Every day | ORAL | 4 refills | Status: AC

## 2024-06-27 NOTE — Telephone Encounter (Signed)
 Spoke with patient. 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 She would like to get daily suppression medication. Advised I would send message to Dr. Ozan for rx and she can check with pharmacy later.

## 2024-06-27 NOTE — Progress Notes (Signed)
 Rx for valtrex  suppression sent in  Tracy Bowditch, DO Attending Obstetrician & Gynecologist, [facility] for [facility], [facility]

## 2024-06-27 NOTE — Telephone Encounter (Signed)
 Patient would like to talk to you about something. Prefers you call her than discuss on MyChart. Please advise.
